# Patient Record
Sex: Female | Born: 1940 | Race: White | Hispanic: No | Smoking: Former smoker
Health system: Southern US, Community
[De-identification: ages and names within clinical notes are randomized; demographics above are authoritative.]

## PROBLEM LIST (undated history)

## (undated) DIAGNOSIS — I5020 Unspecified systolic (congestive) heart failure: Secondary | ICD-10-CM

## (undated) DIAGNOSIS — N83209 Unspecified ovarian cyst, unspecified side: Secondary | ICD-10-CM

## (undated) DIAGNOSIS — N816 Rectocele: Secondary | ICD-10-CM

## (undated) DIAGNOSIS — Q268 Other congenital malformations of great veins: Secondary | ICD-10-CM

## (undated) DIAGNOSIS — Z7901 Long term (current) use of anticoagulants: Secondary | ICD-10-CM

## (undated) DIAGNOSIS — Z8719 Personal history of other diseases of the digestive system: Secondary | ICD-10-CM

## (undated) DIAGNOSIS — I4892 Unspecified atrial flutter: Secondary | ICD-10-CM

## (undated) DIAGNOSIS — T50905A Adverse effect of unspecified drugs, medicaments and biological substances, initial encounter: Secondary | ICD-10-CM

## (undated) DIAGNOSIS — K219 Gastro-esophageal reflux disease without esophagitis: Secondary | ICD-10-CM

## (undated) DIAGNOSIS — Z5181 Encounter for therapeutic drug level monitoring: Secondary | ICD-10-CM

## (undated) DIAGNOSIS — G629 Polyneuropathy, unspecified: Secondary | ICD-10-CM

## (undated) DIAGNOSIS — F419 Anxiety disorder, unspecified: Secondary | ICD-10-CM

## (undated) DIAGNOSIS — I484 Atypical atrial flutter: Secondary | ICD-10-CM

## (undated) DIAGNOSIS — I4719 Other supraventricular tachycardia: Secondary | ICD-10-CM

## (undated) DIAGNOSIS — N35919 Unspecified urethral stricture, male, unspecified site: Secondary | ICD-10-CM

## (undated) DIAGNOSIS — I4891 Unspecified atrial fibrillation: Secondary | ICD-10-CM

## (undated) DIAGNOSIS — I10 Essential (primary) hypertension: Secondary | ICD-10-CM

## (undated) DIAGNOSIS — Z973 Presence of spectacles and contact lenses: Secondary | ICD-10-CM

## (undated) DIAGNOSIS — Z9229 Personal history of other drug therapy: Secondary | ICD-10-CM

## (undated) DIAGNOSIS — IMO0002 Reserved for concepts with insufficient information to code with codable children: Secondary | ICD-10-CM

## (undated) DIAGNOSIS — L309 Dermatitis, unspecified: Secondary | ICD-10-CM

## (undated) DIAGNOSIS — E039 Hypothyroidism, unspecified: Secondary | ICD-10-CM

## (undated) DIAGNOSIS — E785 Hyperlipidemia, unspecified: Secondary | ICD-10-CM

## (undated) DIAGNOSIS — I471 Supraventricular tachycardia: Secondary | ICD-10-CM

## (undated) DIAGNOSIS — I480 Paroxysmal atrial fibrillation: Secondary | ICD-10-CM

## (undated) DIAGNOSIS — Z79899 Other long term (current) drug therapy: Secondary | ICD-10-CM

## (undated) DIAGNOSIS — R200 Anesthesia of skin: Secondary | ICD-10-CM

## (undated) DIAGNOSIS — E871 Hypo-osmolality and hyponatremia: Secondary | ICD-10-CM

## (undated) DIAGNOSIS — I509 Heart failure, unspecified: Secondary | ICD-10-CM

## (undated) DIAGNOSIS — I440 Atrioventricular block, first degree: Secondary | ICD-10-CM

## (undated) DIAGNOSIS — Z9889 Other specified postprocedural states: Secondary | ICD-10-CM

## (undated) DIAGNOSIS — Z8679 Personal history of other diseases of the circulatory system: Secondary | ICD-10-CM

## (undated) DIAGNOSIS — K439 Ventral hernia without obstruction or gangrene: Secondary | ICD-10-CM

## (undated) HISTORY — PX: CARDIAC CATHETERIZATION: SHX172

## (undated) HISTORY — DX: Paroxysmal atrial fibrillation: I48.0

## (undated) HISTORY — DX: Polyneuropathy, unspecified: G62.9

## (undated) HISTORY — DX: Unspecified atrial fibrillation: I48.91

## (undated) HISTORY — DX: Long term (current) use of anticoagulants: Z79.01

## (undated) HISTORY — DX: Ventral hernia without obstruction or gangrene: K43.9

## (undated) HISTORY — PX: TUBAL LIGATION: SHX77

## (undated) HISTORY — DX: Atypical atrial flutter: I48.4

## (undated) HISTORY — DX: Anxiety disorder, unspecified: F41.9

## (undated) HISTORY — DX: Encounter for therapeutic drug level monitoring: Z51.81

## (undated) HISTORY — DX: Gastro-esophageal reflux disease without esophagitis: K21.9

## (undated) HISTORY — DX: Dermatitis, unspecified: L30.9

## (undated) HISTORY — PX: CARDIAC ELECTROPHYSIOLOGY STUDY AND ABLATION: SHX1294

## (undated) HISTORY — DX: Anesthesia of skin: R20.0

## (undated) HISTORY — DX: Rectocele: N81.6

## (undated) HISTORY — DX: Personal history of other diseases of the digestive system: Z87.19

## (undated) HISTORY — DX: Personal history of other drug therapy: Z92.29

## (undated) HISTORY — PX: CATARACT EXTRACTION: SUR2

## (undated) HISTORY — DX: Adverse effect of unspecified drugs, medicaments and biological substances, initial encounter: T50.905A

## (undated) HISTORY — PX: CARDIOVERSION: SHX1299

## (undated) HISTORY — DX: Unspecified atrial flutter: I48.92

## (undated) HISTORY — DX: Other long term (current) drug therapy: Z79.899

## (undated) HISTORY — DX: Hypo-osmolality and hyponatremia: E87.1

## (undated) HISTORY — DX: Unspecified ovarian cyst, unspecified side: N83.209

## (undated) HISTORY — DX: Hypothyroidism, unspecified: E03.9

## (undated) HISTORY — DX: Heart failure, unspecified: I50.9

## (undated) HISTORY — PX: ABDOMINAL HERNIA REPAIR: SHX539

---

## 1976-08-10 HISTORY — PX: TUBAL LIGATION: SHX77

## 1984-08-10 HISTORY — PX: ABDOMINAL HYSTERECTOMY: SHX81

## 1985-06-10 HISTORY — PX: ABDOMINAL HYSTERECTOMY: SHX81

## 2005-07-15 ENCOUNTER — Inpatient Hospital Stay (HOSPITAL_COMMUNITY): Admission: AD | Admit: 2005-07-15 | Discharge: 2005-07-17 | Payer: Self-pay | Admitting: Cardiology

## 2005-07-15 ENCOUNTER — Ambulatory Visit: Payer: Self-pay | Admitting: Cardiology

## 2009-04-08 HISTORY — PX: CARDIOVASCULAR STRESS TEST: SHX262

## 2009-05-20 HISTORY — PX: TRANSESOPHAGEAL ECHOCARDIOGRAM: SHX273

## 2010-09-08 ENCOUNTER — Ambulatory Visit
Admission: RE | Admit: 2010-09-08 | Discharge: 2010-09-08 | Payer: Self-pay | Source: Home / Self Care | Attending: Urology | Admitting: Urology

## 2010-09-08 HISTORY — PX: OTHER SURGICAL HISTORY: SHX169

## 2010-09-08 LAB — POCT I-STAT, CHEM 8
BUN: 11 mg/dL (ref 6–23)
Calcium, Ion: 1.22 mmol/L (ref 1.12–1.32)
Chloride: 105 mEq/L (ref 96–112)
Creatinine, Ser: 1 mg/dL (ref 0.4–1.2)
Glucose, Bld: 111 mg/dL — ABNORMAL HIGH (ref 70–99)
HCT: 43 % (ref 36.0–46.0)
Hemoglobin: 14.6 g/dL (ref 12.0–15.0)
Potassium: 4 mEq/L (ref 3.5–5.1)
Sodium: 139 mEq/L (ref 135–145)
TCO2: 24 mmol/L (ref 0–100)

## 2010-09-08 LAB — PROTIME-INR
INR: 1.03 (ref 0.00–1.49)
Prothrombin Time: 13.7 seconds (ref 11.6–15.2)

## 2010-09-08 NOTE — Op Note (Signed)
NAME:  [name], [name]               ACCOUNT NO.:  [id number]  MEDICAL RECORD NO.:  [id number]          PATIENT TYPE:  AMB  LOCATION:  N[facility]C                         FACILITY:  [facility]  PHYSICIAN:  Jarrick Fjeld C. Vernie Ammons, M.D.  DATE OF BIRTH:  [birth year]  DATE OF PROCEDURE: DATE OF DISCHARGE:                              OPERATIVE REPORT   PREOPERATIVE DIAGNOSES: 1. Cystocele. 2. Stress urinary continence.  POSTOPERATIVE DIAGNOSES: 1. Cystocele. 2. Stress urinary continence.  PROCEDURES: 1. Anterior repair. 2. Transobturator sling.  SURGEON:  Allyn Bartelson C. Vernie Ammons, M.D.  ANESTHESIA:  General.  SPECIMENS:  None.  DRAINS:  None.  PACKING:  Iodoform with bacitracin ointment.  BLOOD LOSS:  Minimal.  COMPLICATIONS:  None.  INDICATIONS:  The patient is a 70 year old female with a significant cystocele.  She has tried a pessary but failed that.  She had no stress incontinence with the cystocele present but when it was reduced this resulted in unmasking of stress urinary incontinence.  We therefore have discussed repair of her cystocele which she would like to proceed with and simultaneous sling placement.  The risks, complications, alternatives and limitations have been discussed and she understands and has elected to proceed.  DESCRIPTION OF OPERATION:  After informed consent, the patient was brought to the major OR, placed on table, administered general anesthesia, and then moved to the dorsal lithotomy position.  The vagina and perineal region was sterilely prepped and draped and official time- out was then performed.  Initially, I placed a 16-French Foley catheter in the bladder and drained the bladder.  I then placed a weighted speculum in the vagina. Marcaine with epinephrine was used to infiltrate the subvaginal mucosa in the midline.  After allowing adequate time for epinephrine effect, a midline incision was then made from the introitus back to the vaginal cuff.  Allis  clamps were placed on the vaginal mucosal edges and the cystocele were then both sharply and bluntly dissected from the vaginal mucosa out laterally until the pubocervical fascia was identified.  The cystocele was reduced by using 2-0 Ethibond suture in a figure-of- eight fashion, reapproximating the pubocervical fascia in the midline. I did this an interrupted fashion which reduced her cystocele completely.  I then excised the redundant vaginal mucosa and began to close the vaginal incision by reapproximating the mucosal edges with running 2-0 Vicryl suture.  I then irrigated the wound with antibiotic solution and drained the bladder once again.  The obturator fossa was then palpated and at a location even with the clitoris 5 cm lateral to the midline on each side, a Debbera Wolken was placed and this was confirmed to be in the area of the obturator fossa.  The skin and subcutaneous tissue were infiltrated with Marcaine and epinephrine and then a stab incision was made in these locations.  With the bladder completely drained, I passed the sling trocar through the stab incision, through the obturator fascia and back behind the symphysis pubis and then directed this out at the mid urethral level first on left and then right sides.  The sling material was then affixed to the trocar and drawn back  through the skin incisions.  The catheter was removed and cystoscopy was performed using a 22-French cystoscope and 70-degree lens.  The bladder was entered and fully inspected in a systematic fashion.  Ureteral orifices were of normal configuration and position.  There were no tumor, stones or inflammatory lesions seen within the bladder.  There no perforation or foreign body identified.  The bladder was drained, the cystoscope removed and the Foley catheter reinserted.  I then adjusted the sling under the mid urethral region and then removed the plastic sheathing on both right and left sides  with forceps placed beneath the sling in order to prevent tension.  The sling laid in good position at the mid urethral level with no tension.  It was therefore irrigated once again and the excess sling material was excised at the skin level.  The skin incisions were closed with Dermabond.  I irrigated the vaginal incision again with antibiotic solution and then proceeded to close the vaginal incision with the initially started 2-0 Vicryl suture.  I then placed 1-inch iodoform gauze with bacitracin as a vaginal packing, removed the Foley catheter and the patient was awakened and taken to recovery room in stable and satisfactory condition.  She tolerated the procedure well with no intraoperative complications.  She will be given a prescription for Tylox #30 as well as Cipro 500 mg #6 with followup in my office in 1 week.  Written instructions were given and she will remove her vaginal packing in the morning.     Kinza Gouveia C. Vernie Ammons, M.D.     MCO/MEDQ  D:  09/08/2010  T:  09/08/2010  Job:  [id number]  Electronically Signed by Ihor Gully M.D. on 09/08/2010 06:15:53 PM

## 2010-12-26 NOTE — Consult Note (Signed)
[name], [name]               ACCOUNT NO.:  [id number]   MEDICAL RECORD NO.:  [id number]          PATIENT TYPE:  INP   LOCATION:  2910                         FACILITY:  [facility]   PHYSICIAN:  [place] Bing, M.D. [facility] OF BIRTH:  1941/04/03   DATE OF CONSULTATION:  07/15/2005  DATE OF DISCHARGE:                                   CONSULTATION   PRIMARY CARE PHYSICIAN:  Dr. Tomasa Blase.   HISTORY OF PRESENT ILLNESS:  A 70 year old woman without known coronary  disease, was transferred from [facility] due to marked EKG  abnormalities associated with chest burning, hypotension and bradycardia.  [name] first came to cardiology attention in 1991 when she presented to  [facility] with atypical chest discomfort. She underwent coronary angiography with  apparently normal results. She has had an echocardiogram in the past and  been told of mitral valve prolapse. She dates her current problems to the  last month or two when she has noted intermittent episodes of weakness.  These are unrelated to exertion. There was no chest discomfort nor dyspnea.  She developed more continuous malaise, prompting a visit to her primary care  physician yesterday morning. Her EKG was abnormal, prompting a decision to  perform outpatient stress testing -- which was scheduled. That evening, she  developed chest burning and came to the emergency department a [facility], where her symptoms worsened. An EKG was repeated and was in fact  markedly abnormal. We do not have the initial tracing for comparison.   She had accompanying nausea, but no dyspnea nor diaphoresis. She developed  bradycardia and hypotension, with a systolic blood pressure approximately  70.  Her treatment was initiated with intravenous heparin, Integrilin and an  aspirin. Her symptoms subsequently resolved, as did hypotension and  bradycardia; however, EKG abnormalities persisted. She was transferred to  [facility] where she  is now asymptomatic.   There is no history of hypertension. The patient was a cigarette smoker,  with a 40 pack-year consumption until approximately 2 years ago. She has not  had hypertension nor diabetes. She has been told of hyperlipidemia in the  past, and treated this with red yeast rice, rather than the recommended  statin.   PAST MEDICAL HISTORY:  Is otherwise notable for hypothyroidism, episodic  depression and GERD. Her only surgery has been a transabdominal  hysterectomy.   RECENT MEDICATIONS:  Have included:  1.  Levothyroxine 0.05 mg q.d.  2.  Prevacid p.r.n.  3.  Lexapro 10 mg q.d.   ALLERGIES:  She reports allergies to AMOXICILLIN and CELEBREX.   SOCIAL HISTORY:  Widowed for the past 8 years and lives alone; retired from  [employer] work; no excessive alcohol use.   FAMILY HISTORY:  Father had multiple myocardial infarctions and eventually  died of coronary disease. Mother died in her 34s with a history of CVA and  CHF.   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 All other systems were  reviewed and are negative.   PHYSICAL EXAMINATION:  GENERAL:  On exam, pleasant, well-appearing woman in  no acute distress.  VITAL SIGNS:  The heart rate is 80 and regular, blood pressure 105/60,  respirations 15, afebrile.  HEENT:  Anicteric sclerae; pupils equal, round, react to light; EOMs full.  NECK: No jugular venous distension; normal carotid upstrokes without bruits.  ENDOCRINE:  No thyromegaly.  HEMATOPOIETIC:  No adenopathy.  SKIN:  No significant lesions.  PSYCHIATRIC: Alert and oriented; normal affect.  CHEST: Resonant to percussion; clear to auscultation.  CARDIAC: Normal first and second heart sounds; fourth heart sound present;  normal PMI.  ABDOMEN: Soft and nontender; Pfannenstiel skin incision; no bruits; normal  bowel sounds; no  masses; no organomegaly.  EXTREMITIES:  Distal pulses intact; no edema.  NEUROMUSCULAR:  Symmetric strength and tone; normal cranial nerves.  MUSCULOSKELETAL:  No joint deformities.   EKG:  Normal sinus rhythm; prominent inferior and anterolateral ST-segment  depression with T-wave inversion. When compared to a tracing obtained 2  hours earlier, the heart rate has increased; inferior ST-segment changes are  more impressive; anterior lateral changes are similar.   OTHER LABORATORY:  (Obtained in [facility]) includes:  an essentially normal  chemistry profile, with slightly decreased sodium, slightly decreased  bicarbonate, slightly increased glucose, slightly increased protein and  slightly increased SGOT. CPK was elevated to 295, with MB of 10.2; but  troponin was negative. The PT and PTT were normal. CBC was normal, except  for white count of 13,700. T4 and T3 were normal, with slightly elevated TSH  of 5.4.   IMPRESSION:  A 70 year old woman with modest cardiovascular risk factors,  presenting with impressive EKG changes and significant symptoms consistent  with unstable angina. Her symptoms have resolved, but EKG changes persist --  perhaps in an apical distribution.   PLAN:  Clopidogrel will be added to her medical regime. We will try to  increase IV fluids and wean her from dopamine. If this is successful,  intravenous nitroglycerin will be given. Cardiac catheterization is planned.  Lipids will be assessed.      [place] Bing, M.D. [facility]  Electronically Signed     RR/MEDQ  D:  07/15/2005  T:  07/15/2005  Job:  [id number]

## 2010-12-26 NOTE — Cardiovascular Report (Signed)
[name], [name]               ACCOUNT NO.:  [id number]   MEDICAL RECORD NO.:  [id number]          PATIENT TYPE:  INP   LOCATION:  2910                         FACILITY:  [facility]   PHYSICIAN:  Arturo Morton. Riley Kill, M.D. [facility] OF BIRTH:  07/09/1941   DATE OF PROCEDURE:  07/15/2005  DATE OF DISCHARGE:                              CARDIAC CATHETERIZATION   INDICATIONS:  Ms. Rinn is a 70 year old who previously underwent cardiac  catheterization at [facility] in [facility]. She has presented with a one-month history  of some weakness. She had some chest pressure. She had an abnormal EKG and  borderline CK-MBs. Troponins were negative. She had some hypotension. She  was subsequently referred for cardiac catheterization.   PROCEDURES:  1.  Left heart catheterization.  2.  Selective coronary arteriography.  3.  Selective left ventriculography.   DESCRIPTION OF PROCEDURE:  The patient was brought to the catheterization  laboratory and prepped and draped in the usual fashion.  Through an anterior  puncture, the right femoral artery was easily entered, and a 6-French sheath  was placed.  Views of the left and right coronary arteries were obtained in  multiple angiographic projections. Central aortic and left ventricular  pressures were measured with the pigtail. Ventriculography was performed in  the RAO projection. Overall, systolic function was preserved. I had Dr.  Diona Browner come down and review the films with me. After a careful discussion,  we elected not to proceed with any type of percutaneous intervention. The  patient was taken to the holding area in satisfactory clinical condition.  Residual CPK, MBs, and troponins as well as a D-dimer will be obtained.  Further evaluation will be based on the patient findings. We will attempt to  exclude pulmonary embolus as a cause of her presentation as well as watch  for evidence of acute coronary syndrome. I also spoke with Dr. Sylvie Farrier was in  the process  of looking for old EKGs so that we can compare the current study  to that.   HEMODYNAMIC DATA:  1.  Central aortic pressure 117/64, mean 87.  2.  Left ventricular pressure 108/8.  3.  No gradient on pullback across the aortic valve.   ANGIOGRAPHIC DATA:  1.  Left main was free of critical disease.  2.  The LAD coursed to the apex. The LAD divided into a large diagonal      branch and an LAD in the mid vessel.  Just after the takeoff of the      diagonal branch, there was about an eccentric area of 40% narrowing. All      of [place] looked at this in careful detail.  It did not clearly look like a      ruptured plaque. It also did not appear to be critical.  3.  There is a circumflex intermediate that is small and without critical      narrowing with other than minor luminal irregularity in its mid-portion.  4.  The circumflex consists of two modest size marginal branches which are      free of critical disease.  5.  The right coronary is a fairly large-caliber vessel providing a      posterior descending and posterolateral branch. There is a fairly short      discrete area of 40-50% narrowing in the mid-vessel followed by a      somewhat more segmental area of mild luminal plaquing. However, none of      this appears to be critical.   Ventriculography in the RAO projection was associated with some ventricular  ectopy. However, post PVC beats suggested fairly vigorous overall left  ventricular function without a definite wall motion abnormality.   CONCLUSION:  1.  Preserved left ventricular function.  2.  A 40% lesion of the left anterior descending artery after the takeoff of      the diagonal branch.  3.  40-50% mid-RCA stenosis.   DISPOSITION:  I reviewed the films with Dr. Diona Browner. Neither of [place] feel  that percutaneous intervention is warranted at the present time. It is not  clear to me whether the patient actually had a non-ST-elevation MI or not.  We will get serial enzymes.  We will also check a D-dimer, and I will have a  low threshold to do a CT angiogram to exclude pulmonary embolus as another  potential alternative diagnosis.  Moreover, I have spoken with Dr. Sylvie Farrier,  and he is checking the old records to see if her abnormalities from an  electrocardiographic standpoint of chronic. She does carry a diagnosis of  mitral valve prolapse, and reportedly had an abnormal stress test in the  past leading to cardiac catheterization in 1991 at [facility]. They told her they was surprised that her angiogram was normal at  that time, according to the patient. Based on this, we will likely recommend  a continued medical program. I will add Plavix to her regimen, and we will  decide on further diagnostic testing.      Arturo Morton. Riley Kill, M.D. [facility]  Electronically Signed     TDS/MEDQ  D:  07/15/2005  T:  07/15/2005  Job:  [id number]   cc:   [place] Bing, M.D. [facility]  [address]  [address]  [place]  [place] [address]   Heide Guile, MD  Fax: [phone]   Barney Drain, M.D.   [facility]

## 2010-12-26 NOTE — Discharge Summary (Signed)
NAMELIYANA, SUNIGA               ACCOUNT NO.:  [id number]   MEDICAL RECORD NO.:  [id number]          PATIENT TYPE:  INP   LOCATION:  2017                         FACILITY:  [facility]   PHYSICIAN:  Jonelle Sidle, M.D. LHCDATE OF BIRTH:  November 27, 1940   DATE OF ADMISSION:  07/15/2005  DATE OF DISCHARGE:  07/17/2005                           DISCHARGE SUMMARY - REFERRING   SUMMARY OF HISTORY:  Ms. Dieu is a 70 year old white female who was  transferred from [facility] secondary to EKG abnormalities associated  with chest burning, hypotension, and bradycardia.   Her medical history is notable for cardiac catheterization at [facility] in 1991  with reported normal results. Echocardiogram in the past, according to the  patient, has shown mitral valve prolapse. She has a history of remote  tobacco abuse, hyperlipidemia, hypothyroidism, episodic depression, and  GERD.   LABORATORY DATA:  Labs at [facility] show a sodium of 136, potassium  4.4, BUN 15, creatinine 1.0, glucose 71. H&H 14.1 and 41.6, normal indices,  platelets 275,000, WBC 8.1. TSH was slightly elevated at 5.40, however T4  was 8.4 and T3 uptake was 1.02. PTT 31.7, PT 10.2. Initial CK-MB at [facility] was elevated at 294 and 10.2. Troponin was 0.00. EKGs at [facility] showed normal sinus rhythm, inferolateral ST segment abnormalities.  A subsequent EKG from [facility], dated on the 6th, shows sinus  bradycardia of 49, continued ST-segment abnormalities. Chest x-ray from  [place] did not show any abnormality, chronic lung disease. On transfer,  H&H was 12.8 and 36.2, normal indices, platelet count 249,000. WBC 9.2.  Subsequent hematologies were all unremarkable. D-dimer was less than 0.22.  On transfer sodium was 133, potassium 23.6, BUN 7, creatinine 0.8, normal  LFTs, glucose 141. Subsequent chemistries were unremarkable. On transfer  total CK was elevated 213 with an MB of 6.6 and relative  index of 3.1.  Troponin was 0.01.  Subsequent CK, total MB, and relative index were  negative for myocardial infarction. Subsequent troponins were 0.23 and 0.03.  Fasting lipid showed a total cholesterol of 229, triglycerides 58, HDL 54,  LDL 163. EKGs at [facility] showed normal sinus rhythm, persistent ST-T  wave inversion inferolaterally.   HOSPITAL COURSE:  Ms. Ashlock was admitted to 2910, continued on her home  medications, and placed on IV heparin. On the afternoon of the 6th Dr.  Riley Kill performed cardiac catheterization which showed normal left  ventricular function, 40% LAD, and 40% to 50% mid RCA. A D-dimer was checked  which was also negative. Dr. Riley Kill, after review, did not feel her cardiac  catheterization showed significant coronary artery disease, however he  recommended continued Plavix and aggressive risk factor modification. On  December 7th she was assessed by Dr. Tenny Craw and the patient stated that she  felt fuzzy in the head and not just quite right.  Dr. Tenny Craw felt that she  should be continued to be treated with aspirin and Plavix at this time, but  the Plavix could possibly be discontinued in the future. She again  recommended Statin for her hyperlipidemia to get the  LDL approximately 80  and recommended Vytorin.  The patient was transferred to the floor with  increased activity. Dr. Tenny Craw also noted prior to admission she was taking  800 of ibuprofen every day for shoulder discomfort. Case management assisted  for discharge needs. By July 17, 2005, despite being D-dimer being  negative, it was felt that she should undergo a chest CT to exclude aortic  dissection and pulmonary embolism. This was performed and did not show any  acute abnormalities. Findings were discussed with the patient and she stated  that she wished to be discharged. Dr. Diona Browner was in agreement with this  and referred any further workup to Dr. Tomasa Blase and Dr. Phoebe Perch.   PROCEDURE PERFORMED:   Cardiac catheterization on July 15, 2005.   DISCHARGE DIAGNOSES:  1.  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  2.  Hyperlipidemia.  3.  Abnormal electrocardiogram.  4.  Hypotension.  5.  Bradycardia of uncertain etiology.   DISPOSITION:  She was asked to maintain a low-fat, low-salt, low-cholesterol  diet. Activity is per the supplemental discharge sheet. She is asked to  bring all medications to all follow-up appointments. Her new medications  include Vytorin 10/20 mg at bedtime, aspirin 325 mg daily, Plavix 75 mg  daily per Dr. Phoebe Perch to determine the duration. She was asked to continue  her levothyroxine 0.5 mg daily, Prevacid 20 mg daily rather than as needed,  and Lexapro 10 mg daily. She was asked to call Dr. Tomasa Blase and Dr. Phoebe Perch  for follow-up appointments. Given Vytorin was started for her hyperlipidemia  she will need bloodwork in approximately six to eight weeks to re-assess her  lipids and liver since Vytorin was initiated.      Joellyn Rued, P.A. [facility]    ______________________________  Jonelle Sidle, M.D. [facility]    EW/MEDQ  D:  07/17/2005  T:  07/17/2005  Job:  [id number]   cc:   Dr. Wannetta Sender, [place]   Dr. Fransico Him, [place]

## 2011-08-24 DIAGNOSIS — I4891 Unspecified atrial fibrillation: Secondary | ICD-10-CM | POA: Diagnosis not present

## 2011-08-24 DIAGNOSIS — Z7901 Long term (current) use of anticoagulants: Secondary | ICD-10-CM | POA: Diagnosis not present

## 2011-08-27 DIAGNOSIS — Z1231 Encounter for screening mammogram for malignant neoplasm of breast: Secondary | ICD-10-CM | POA: Diagnosis not present

## 2011-09-21 DIAGNOSIS — Z7901 Long term (current) use of anticoagulants: Secondary | ICD-10-CM | POA: Diagnosis not present

## 2011-09-21 DIAGNOSIS — I4891 Unspecified atrial fibrillation: Secondary | ICD-10-CM | POA: Diagnosis not present

## 2011-10-01 DIAGNOSIS — E785 Hyperlipidemia, unspecified: Secondary | ICD-10-CM | POA: Diagnosis not present

## 2011-10-01 DIAGNOSIS — I4891 Unspecified atrial fibrillation: Secondary | ICD-10-CM | POA: Diagnosis not present

## 2011-10-01 DIAGNOSIS — I10 Essential (primary) hypertension: Secondary | ICD-10-CM | POA: Diagnosis not present

## 2011-10-09 DIAGNOSIS — I4891 Unspecified atrial fibrillation: Secondary | ICD-10-CM | POA: Diagnosis not present

## 2011-10-09 DIAGNOSIS — K219 Gastro-esophageal reflux disease without esophagitis: Secondary | ICD-10-CM | POA: Diagnosis not present

## 2011-10-09 DIAGNOSIS — I10 Essential (primary) hypertension: Secondary | ICD-10-CM | POA: Diagnosis not present

## 2011-10-09 DIAGNOSIS — E785 Hyperlipidemia, unspecified: Secondary | ICD-10-CM | POA: Diagnosis not present

## 2011-10-19 DIAGNOSIS — I10 Essential (primary) hypertension: Secondary | ICD-10-CM | POA: Diagnosis not present

## 2011-10-19 DIAGNOSIS — Z7901 Long term (current) use of anticoagulants: Secondary | ICD-10-CM | POA: Diagnosis not present

## 2011-10-19 DIAGNOSIS — Z79899 Other long term (current) drug therapy: Secondary | ICD-10-CM | POA: Diagnosis not present

## 2011-10-19 DIAGNOSIS — E039 Hypothyroidism, unspecified: Secondary | ICD-10-CM | POA: Diagnosis not present

## 2011-10-19 DIAGNOSIS — I4891 Unspecified atrial fibrillation: Secondary | ICD-10-CM | POA: Diagnosis not present

## 2011-10-19 DIAGNOSIS — Z6826 Body mass index (BMI) 26.0-26.9, adult: Secondary | ICD-10-CM | POA: Diagnosis not present

## 2011-10-19 DIAGNOSIS — E785 Hyperlipidemia, unspecified: Secondary | ICD-10-CM | POA: Diagnosis not present

## 2011-10-20 DIAGNOSIS — N760 Acute vaginitis: Secondary | ICD-10-CM | POA: Diagnosis not present

## 2011-10-20 DIAGNOSIS — N816 Rectocele: Secondary | ICD-10-CM | POA: Diagnosis not present

## 2011-11-03 DIAGNOSIS — L608 Other nail disorders: Secondary | ICD-10-CM | POA: Diagnosis not present

## 2011-11-11 DIAGNOSIS — I4891 Unspecified atrial fibrillation: Secondary | ICD-10-CM | POA: Diagnosis not present

## 2011-11-11 DIAGNOSIS — K219 Gastro-esophageal reflux disease without esophagitis: Secondary | ICD-10-CM | POA: Diagnosis not present

## 2011-11-11 DIAGNOSIS — I10 Essential (primary) hypertension: Secondary | ICD-10-CM | POA: Diagnosis not present

## 2011-11-11 DIAGNOSIS — E785 Hyperlipidemia, unspecified: Secondary | ICD-10-CM | POA: Diagnosis not present

## 2011-11-16 DIAGNOSIS — I4891 Unspecified atrial fibrillation: Secondary | ICD-10-CM | POA: Diagnosis not present

## 2011-11-16 DIAGNOSIS — Z7901 Long term (current) use of anticoagulants: Secondary | ICD-10-CM | POA: Diagnosis not present

## 2011-12-14 DIAGNOSIS — Z7901 Long term (current) use of anticoagulants: Secondary | ICD-10-CM | POA: Diagnosis not present

## 2011-12-14 DIAGNOSIS — I4891 Unspecified atrial fibrillation: Secondary | ICD-10-CM | POA: Diagnosis not present

## 2011-12-21 DIAGNOSIS — I4891 Unspecified atrial fibrillation: Secondary | ICD-10-CM | POA: Diagnosis not present

## 2011-12-21 DIAGNOSIS — I10 Essential (primary) hypertension: Secondary | ICD-10-CM | POA: Diagnosis not present

## 2011-12-21 DIAGNOSIS — E785 Hyperlipidemia, unspecified: Secondary | ICD-10-CM | POA: Diagnosis not present

## 2011-12-30 DIAGNOSIS — I4891 Unspecified atrial fibrillation: Secondary | ICD-10-CM | POA: Diagnosis not present

## 2011-12-30 DIAGNOSIS — Z7901 Long term (current) use of anticoagulants: Secondary | ICD-10-CM | POA: Diagnosis not present

## 2012-01-07 DIAGNOSIS — R233 Spontaneous ecchymoses: Secondary | ICD-10-CM | POA: Diagnosis not present

## 2012-01-07 DIAGNOSIS — L570 Actinic keratosis: Secondary | ICD-10-CM | POA: Diagnosis not present

## 2012-01-07 DIAGNOSIS — L821 Other seborrheic keratosis: Secondary | ICD-10-CM | POA: Diagnosis not present

## 2012-01-13 DIAGNOSIS — Z7901 Long term (current) use of anticoagulants: Secondary | ICD-10-CM | POA: Diagnosis not present

## 2012-01-13 DIAGNOSIS — I4891 Unspecified atrial fibrillation: Secondary | ICD-10-CM | POA: Diagnosis not present

## 2012-01-27 DIAGNOSIS — Z7901 Long term (current) use of anticoagulants: Secondary | ICD-10-CM | POA: Diagnosis not present

## 2012-01-27 DIAGNOSIS — I4891 Unspecified atrial fibrillation: Secondary | ICD-10-CM | POA: Diagnosis not present

## 2012-02-15 DIAGNOSIS — Z6827 Body mass index (BMI) 27.0-27.9, adult: Secondary | ICD-10-CM | POA: Diagnosis not present

## 2012-02-15 DIAGNOSIS — I10 Essential (primary) hypertension: Secondary | ICD-10-CM | POA: Diagnosis not present

## 2012-02-15 DIAGNOSIS — E785 Hyperlipidemia, unspecified: Secondary | ICD-10-CM | POA: Diagnosis not present

## 2012-02-15 DIAGNOSIS — E039 Hypothyroidism, unspecified: Secondary | ICD-10-CM | POA: Diagnosis not present

## 2012-02-15 DIAGNOSIS — Z7901 Long term (current) use of anticoagulants: Secondary | ICD-10-CM | POA: Diagnosis not present

## 2012-02-15 DIAGNOSIS — I4891 Unspecified atrial fibrillation: Secondary | ICD-10-CM | POA: Diagnosis not present

## 2012-02-22 DIAGNOSIS — I4891 Unspecified atrial fibrillation: Secondary | ICD-10-CM | POA: Diagnosis not present

## 2012-02-22 DIAGNOSIS — Z7901 Long term (current) use of anticoagulants: Secondary | ICD-10-CM | POA: Diagnosis not present

## 2012-03-07 DIAGNOSIS — I4891 Unspecified atrial fibrillation: Secondary | ICD-10-CM | POA: Diagnosis not present

## 2012-03-07 DIAGNOSIS — Z7901 Long term (current) use of anticoagulants: Secondary | ICD-10-CM | POA: Diagnosis not present

## 2012-03-21 DIAGNOSIS — Z7901 Long term (current) use of anticoagulants: Secondary | ICD-10-CM | POA: Diagnosis not present

## 2012-03-21 DIAGNOSIS — I4891 Unspecified atrial fibrillation: Secondary | ICD-10-CM | POA: Diagnosis not present

## 2012-03-28 DIAGNOSIS — J019 Acute sinusitis, unspecified: Secondary | ICD-10-CM | POA: Diagnosis not present

## 2012-03-28 DIAGNOSIS — E049 Nontoxic goiter, unspecified: Secondary | ICD-10-CM | POA: Diagnosis not present

## 2012-03-28 DIAGNOSIS — I10 Essential (primary) hypertension: Secondary | ICD-10-CM | POA: Diagnosis not present

## 2012-03-28 DIAGNOSIS — N390 Urinary tract infection, site not specified: Secondary | ICD-10-CM | POA: Diagnosis not present

## 2012-04-04 DIAGNOSIS — Z7901 Long term (current) use of anticoagulants: Secondary | ICD-10-CM | POA: Diagnosis not present

## 2012-04-04 DIAGNOSIS — I4891 Unspecified atrial fibrillation: Secondary | ICD-10-CM | POA: Diagnosis not present

## 2012-04-06 DIAGNOSIS — Z7901 Long term (current) use of anticoagulants: Secondary | ICD-10-CM | POA: Diagnosis not present

## 2012-04-06 DIAGNOSIS — I4891 Unspecified atrial fibrillation: Secondary | ICD-10-CM | POA: Diagnosis not present

## 2012-04-13 DIAGNOSIS — I4891 Unspecified atrial fibrillation: Secondary | ICD-10-CM | POA: Diagnosis not present

## 2012-04-13 DIAGNOSIS — Z7901 Long term (current) use of anticoagulants: Secondary | ICD-10-CM | POA: Diagnosis not present

## 2012-04-18 DIAGNOSIS — H9209 Otalgia, unspecified ear: Secondary | ICD-10-CM | POA: Diagnosis not present

## 2012-04-19 DIAGNOSIS — H9209 Otalgia, unspecified ear: Secondary | ICD-10-CM | POA: Diagnosis not present

## 2012-04-19 DIAGNOSIS — M5382 Other specified dorsopathies, cervical region: Secondary | ICD-10-CM | POA: Diagnosis not present

## 2012-04-19 DIAGNOSIS — S139XXA Sprain of joints and ligaments of unspecified parts of neck, initial encounter: Secondary | ICD-10-CM | POA: Diagnosis not present

## 2012-04-19 DIAGNOSIS — M62838 Other muscle spasm: Secondary | ICD-10-CM | POA: Diagnosis not present

## 2012-04-20 DIAGNOSIS — Z7901 Long term (current) use of anticoagulants: Secondary | ICD-10-CM | POA: Diagnosis not present

## 2012-04-20 DIAGNOSIS — I4891 Unspecified atrial fibrillation: Secondary | ICD-10-CM | POA: Diagnosis not present

## 2012-04-22 DIAGNOSIS — M2560 Stiffness of unspecified joint, not elsewhere classified: Secondary | ICD-10-CM | POA: Diagnosis not present

## 2012-04-22 DIAGNOSIS — R293 Abnormal posture: Secondary | ICD-10-CM | POA: Diagnosis not present

## 2012-04-22 DIAGNOSIS — R6884 Jaw pain: Secondary | ICD-10-CM | POA: Diagnosis not present

## 2012-04-22 DIAGNOSIS — IMO0001 Reserved for inherently not codable concepts without codable children: Secondary | ICD-10-CM | POA: Diagnosis not present

## 2012-04-22 DIAGNOSIS — M26609 Unspecified temporomandibular joint disorder, unspecified side: Secondary | ICD-10-CM | POA: Diagnosis not present

## 2012-04-22 DIAGNOSIS — M62838 Other muscle spasm: Secondary | ICD-10-CM | POA: Diagnosis not present

## 2012-04-25 DIAGNOSIS — R6884 Jaw pain: Secondary | ICD-10-CM | POA: Diagnosis not present

## 2012-04-25 DIAGNOSIS — M26609 Unspecified temporomandibular joint disorder, unspecified side: Secondary | ICD-10-CM | POA: Diagnosis not present

## 2012-04-25 DIAGNOSIS — IMO0001 Reserved for inherently not codable concepts without codable children: Secondary | ICD-10-CM | POA: Diagnosis not present

## 2012-04-25 DIAGNOSIS — M2560 Stiffness of unspecified joint, not elsewhere classified: Secondary | ICD-10-CM | POA: Diagnosis not present

## 2012-04-25 DIAGNOSIS — R293 Abnormal posture: Secondary | ICD-10-CM | POA: Diagnosis not present

## 2012-04-25 DIAGNOSIS — M62838 Other muscle spasm: Secondary | ICD-10-CM | POA: Diagnosis not present

## 2012-04-28 DIAGNOSIS — M62838 Other muscle spasm: Secondary | ICD-10-CM | POA: Diagnosis not present

## 2012-04-28 DIAGNOSIS — M26609 Unspecified temporomandibular joint disorder, unspecified side: Secondary | ICD-10-CM | POA: Diagnosis not present

## 2012-04-28 DIAGNOSIS — R6884 Jaw pain: Secondary | ICD-10-CM | POA: Diagnosis not present

## 2012-04-28 DIAGNOSIS — R293 Abnormal posture: Secondary | ICD-10-CM | POA: Diagnosis not present

## 2012-04-28 DIAGNOSIS — IMO0001 Reserved for inherently not codable concepts without codable children: Secondary | ICD-10-CM | POA: Diagnosis not present

## 2012-04-28 DIAGNOSIS — M2560 Stiffness of unspecified joint, not elsewhere classified: Secondary | ICD-10-CM | POA: Diagnosis not present

## 2012-05-02 DIAGNOSIS — E785 Hyperlipidemia, unspecified: Secondary | ICD-10-CM | POA: Diagnosis not present

## 2012-05-02 DIAGNOSIS — I4891 Unspecified atrial fibrillation: Secondary | ICD-10-CM | POA: Diagnosis not present

## 2012-05-02 DIAGNOSIS — I498 Other specified cardiac arrhythmias: Secondary | ICD-10-CM | POA: Diagnosis not present

## 2012-05-02 DIAGNOSIS — I10 Essential (primary) hypertension: Secondary | ICD-10-CM | POA: Diagnosis not present

## 2012-05-03 DIAGNOSIS — M62838 Other muscle spasm: Secondary | ICD-10-CM | POA: Diagnosis not present

## 2012-05-03 DIAGNOSIS — M2560 Stiffness of unspecified joint, not elsewhere classified: Secondary | ICD-10-CM | POA: Diagnosis not present

## 2012-05-03 DIAGNOSIS — R6884 Jaw pain: Secondary | ICD-10-CM | POA: Diagnosis not present

## 2012-05-03 DIAGNOSIS — R293 Abnormal posture: Secondary | ICD-10-CM | POA: Diagnosis not present

## 2012-05-03 DIAGNOSIS — IMO0001 Reserved for inherently not codable concepts without codable children: Secondary | ICD-10-CM | POA: Diagnosis not present

## 2012-05-03 DIAGNOSIS — M26609 Unspecified temporomandibular joint disorder, unspecified side: Secondary | ICD-10-CM | POA: Diagnosis not present

## 2012-05-04 DIAGNOSIS — Z7901 Long term (current) use of anticoagulants: Secondary | ICD-10-CM | POA: Diagnosis not present

## 2012-05-04 DIAGNOSIS — I4891 Unspecified atrial fibrillation: Secondary | ICD-10-CM | POA: Diagnosis not present

## 2012-05-05 DIAGNOSIS — R6884 Jaw pain: Secondary | ICD-10-CM | POA: Diagnosis not present

## 2012-05-05 DIAGNOSIS — IMO0001 Reserved for inherently not codable concepts without codable children: Secondary | ICD-10-CM | POA: Diagnosis not present

## 2012-05-05 DIAGNOSIS — M26609 Unspecified temporomandibular joint disorder, unspecified side: Secondary | ICD-10-CM | POA: Diagnosis not present

## 2012-05-05 DIAGNOSIS — R293 Abnormal posture: Secondary | ICD-10-CM | POA: Diagnosis not present

## 2012-05-05 DIAGNOSIS — M62838 Other muscle spasm: Secondary | ICD-10-CM | POA: Diagnosis not present

## 2012-05-05 DIAGNOSIS — M2560 Stiffness of unspecified joint, not elsewhere classified: Secondary | ICD-10-CM | POA: Diagnosis not present

## 2012-05-10 DIAGNOSIS — R293 Abnormal posture: Secondary | ICD-10-CM | POA: Diagnosis not present

## 2012-05-10 DIAGNOSIS — M62838 Other muscle spasm: Secondary | ICD-10-CM | POA: Diagnosis not present

## 2012-05-10 DIAGNOSIS — M2560 Stiffness of unspecified joint, not elsewhere classified: Secondary | ICD-10-CM | POA: Diagnosis not present

## 2012-05-10 DIAGNOSIS — R6884 Jaw pain: Secondary | ICD-10-CM | POA: Diagnosis not present

## 2012-05-10 DIAGNOSIS — IMO0001 Reserved for inherently not codable concepts without codable children: Secondary | ICD-10-CM | POA: Diagnosis not present

## 2012-05-10 DIAGNOSIS — M26609 Unspecified temporomandibular joint disorder, unspecified side: Secondary | ICD-10-CM | POA: Diagnosis not present

## 2012-05-12 DIAGNOSIS — R293 Abnormal posture: Secondary | ICD-10-CM | POA: Diagnosis not present

## 2012-05-12 DIAGNOSIS — M2560 Stiffness of unspecified joint, not elsewhere classified: Secondary | ICD-10-CM | POA: Diagnosis not present

## 2012-05-12 DIAGNOSIS — M62838 Other muscle spasm: Secondary | ICD-10-CM | POA: Diagnosis not present

## 2012-05-12 DIAGNOSIS — M26609 Unspecified temporomandibular joint disorder, unspecified side: Secondary | ICD-10-CM | POA: Diagnosis not present

## 2012-05-12 DIAGNOSIS — R6884 Jaw pain: Secondary | ICD-10-CM | POA: Diagnosis not present

## 2012-05-12 DIAGNOSIS — IMO0001 Reserved for inherently not codable concepts without codable children: Secondary | ICD-10-CM | POA: Diagnosis not present

## 2012-05-20 DIAGNOSIS — R6884 Jaw pain: Secondary | ICD-10-CM | POA: Diagnosis not present

## 2012-05-20 DIAGNOSIS — M26609 Unspecified temporomandibular joint disorder, unspecified side: Secondary | ICD-10-CM | POA: Diagnosis not present

## 2012-05-20 DIAGNOSIS — R293 Abnormal posture: Secondary | ICD-10-CM | POA: Diagnosis not present

## 2012-05-20 DIAGNOSIS — IMO0001 Reserved for inherently not codable concepts without codable children: Secondary | ICD-10-CM | POA: Diagnosis not present

## 2012-05-20 DIAGNOSIS — M2560 Stiffness of unspecified joint, not elsewhere classified: Secondary | ICD-10-CM | POA: Diagnosis not present

## 2012-05-20 DIAGNOSIS — M62838 Other muscle spasm: Secondary | ICD-10-CM | POA: Diagnosis not present

## 2012-05-25 DIAGNOSIS — I10 Essential (primary) hypertension: Secondary | ICD-10-CM | POA: Diagnosis not present

## 2012-05-25 DIAGNOSIS — Z6827 Body mass index (BMI) 27.0-27.9, adult: Secondary | ICD-10-CM | POA: Diagnosis not present

## 2012-05-25 DIAGNOSIS — E039 Hypothyroidism, unspecified: Secondary | ICD-10-CM | POA: Diagnosis not present

## 2012-05-25 DIAGNOSIS — E785 Hyperlipidemia, unspecified: Secondary | ICD-10-CM | POA: Diagnosis not present

## 2012-05-25 DIAGNOSIS — I4891 Unspecified atrial fibrillation: Secondary | ICD-10-CM | POA: Diagnosis not present

## 2012-06-02 DIAGNOSIS — Z7901 Long term (current) use of anticoagulants: Secondary | ICD-10-CM | POA: Diagnosis not present

## 2012-06-02 DIAGNOSIS — I4891 Unspecified atrial fibrillation: Secondary | ICD-10-CM | POA: Diagnosis not present

## 2012-06-16 DIAGNOSIS — Z7901 Long term (current) use of anticoagulants: Secondary | ICD-10-CM | POA: Diagnosis not present

## 2012-06-16 DIAGNOSIS — I10 Essential (primary) hypertension: Secondary | ICD-10-CM | POA: Diagnosis not present

## 2012-06-16 DIAGNOSIS — E785 Hyperlipidemia, unspecified: Secondary | ICD-10-CM | POA: Diagnosis not present

## 2012-06-16 DIAGNOSIS — I4891 Unspecified atrial fibrillation: Secondary | ICD-10-CM | POA: Diagnosis not present

## 2012-06-16 DIAGNOSIS — I498 Other specified cardiac arrhythmias: Secondary | ICD-10-CM | POA: Diagnosis not present

## 2012-06-16 DIAGNOSIS — R002 Palpitations: Secondary | ICD-10-CM | POA: Diagnosis not present

## 2012-06-20 DIAGNOSIS — M26629 Arthralgia of temporomandibular joint, unspecified side: Secondary | ICD-10-CM | POA: Diagnosis not present

## 2012-06-20 DIAGNOSIS — M542 Cervicalgia: Secondary | ICD-10-CM | POA: Diagnosis not present

## 2012-06-22 DIAGNOSIS — R002 Palpitations: Secondary | ICD-10-CM | POA: Diagnosis not present

## 2012-06-23 DIAGNOSIS — M26629 Arthralgia of temporomandibular joint, unspecified side: Secondary | ICD-10-CM | POA: Diagnosis not present

## 2012-06-23 DIAGNOSIS — M542 Cervicalgia: Secondary | ICD-10-CM | POA: Diagnosis not present

## 2012-06-27 DIAGNOSIS — M26629 Arthralgia of temporomandibular joint, unspecified side: Secondary | ICD-10-CM | POA: Diagnosis not present

## 2012-06-27 DIAGNOSIS — M542 Cervicalgia: Secondary | ICD-10-CM | POA: Diagnosis not present

## 2012-06-28 DIAGNOSIS — R51 Headache: Secondary | ICD-10-CM | POA: Diagnosis not present

## 2012-06-28 DIAGNOSIS — I6529 Occlusion and stenosis of unspecified carotid artery: Secondary | ICD-10-CM | POA: Diagnosis not present

## 2012-06-28 DIAGNOSIS — M542 Cervicalgia: Secondary | ICD-10-CM | POA: Diagnosis not present

## 2012-06-30 DIAGNOSIS — M542 Cervicalgia: Secondary | ICD-10-CM | POA: Diagnosis not present

## 2012-06-30 DIAGNOSIS — I4891 Unspecified atrial fibrillation: Secondary | ICD-10-CM | POA: Diagnosis not present

## 2012-06-30 DIAGNOSIS — M26629 Arthralgia of temporomandibular joint, unspecified side: Secondary | ICD-10-CM | POA: Diagnosis not present

## 2012-06-30 DIAGNOSIS — Z7901 Long term (current) use of anticoagulants: Secondary | ICD-10-CM | POA: Diagnosis not present

## 2012-07-04 DIAGNOSIS — M26629 Arthralgia of temporomandibular joint, unspecified side: Secondary | ICD-10-CM | POA: Diagnosis not present

## 2012-07-04 DIAGNOSIS — M542 Cervicalgia: Secondary | ICD-10-CM | POA: Diagnosis not present

## 2012-07-14 DIAGNOSIS — M542 Cervicalgia: Secondary | ICD-10-CM | POA: Diagnosis not present

## 2012-07-14 DIAGNOSIS — M26629 Arthralgia of temporomandibular joint, unspecified side: Secondary | ICD-10-CM | POA: Diagnosis not present

## 2012-07-21 DIAGNOSIS — R002 Palpitations: Secondary | ICD-10-CM | POA: Diagnosis not present

## 2012-07-29 DIAGNOSIS — Z7901 Long term (current) use of anticoagulants: Secondary | ICD-10-CM | POA: Diagnosis not present

## 2012-07-29 DIAGNOSIS — I4891 Unspecified atrial fibrillation: Secondary | ICD-10-CM | POA: Diagnosis not present

## 2012-08-11 DIAGNOSIS — I4891 Unspecified atrial fibrillation: Secondary | ICD-10-CM | POA: Diagnosis not present

## 2012-08-26 DIAGNOSIS — Z7901 Long term (current) use of anticoagulants: Secondary | ICD-10-CM | POA: Diagnosis not present

## 2012-08-26 DIAGNOSIS — R002 Palpitations: Secondary | ICD-10-CM | POA: Diagnosis not present

## 2012-08-26 DIAGNOSIS — I4891 Unspecified atrial fibrillation: Secondary | ICD-10-CM | POA: Diagnosis not present

## 2012-09-01 DIAGNOSIS — J209 Acute bronchitis, unspecified: Secondary | ICD-10-CM | POA: Diagnosis not present

## 2012-09-01 DIAGNOSIS — Z6828 Body mass index (BMI) 28.0-28.9, adult: Secondary | ICD-10-CM | POA: Diagnosis not present

## 2012-09-13 DIAGNOSIS — I10 Essential (primary) hypertension: Secondary | ICD-10-CM | POA: Diagnosis not present

## 2012-09-13 DIAGNOSIS — I4891 Unspecified atrial fibrillation: Secondary | ICD-10-CM | POA: Diagnosis not present

## 2012-09-13 DIAGNOSIS — Z6827 Body mass index (BMI) 27.0-27.9, adult: Secondary | ICD-10-CM | POA: Diagnosis not present

## 2012-09-13 DIAGNOSIS — E039 Hypothyroidism, unspecified: Secondary | ICD-10-CM | POA: Diagnosis not present

## 2012-09-13 DIAGNOSIS — E785 Hyperlipidemia, unspecified: Secondary | ICD-10-CM | POA: Diagnosis not present

## 2012-09-13 DIAGNOSIS — N390 Urinary tract infection, site not specified: Secondary | ICD-10-CM | POA: Diagnosis not present

## 2012-09-19 DIAGNOSIS — Z1231 Encounter for screening mammogram for malignant neoplasm of breast: Secondary | ICD-10-CM | POA: Diagnosis not present

## 2012-09-19 DIAGNOSIS — M949 Disorder of cartilage, unspecified: Secondary | ICD-10-CM | POA: Diagnosis not present

## 2012-09-19 DIAGNOSIS — M899 Disorder of bone, unspecified: Secondary | ICD-10-CM | POA: Diagnosis not present

## 2012-09-19 DIAGNOSIS — N951 Menopausal and female climacteric states: Secondary | ICD-10-CM | POA: Diagnosis not present

## 2012-09-27 DIAGNOSIS — Z7901 Long term (current) use of anticoagulants: Secondary | ICD-10-CM | POA: Diagnosis not present

## 2012-09-27 DIAGNOSIS — I4891 Unspecified atrial fibrillation: Secondary | ICD-10-CM | POA: Diagnosis not present

## 2012-10-05 DIAGNOSIS — I4891 Unspecified atrial fibrillation: Secondary | ICD-10-CM | POA: Diagnosis not present

## 2012-10-05 DIAGNOSIS — E785 Hyperlipidemia, unspecified: Secondary | ICD-10-CM | POA: Diagnosis not present

## 2012-10-05 DIAGNOSIS — I10 Essential (primary) hypertension: Secondary | ICD-10-CM | POA: Diagnosis not present

## 2012-10-10 DIAGNOSIS — Z7901 Long term (current) use of anticoagulants: Secondary | ICD-10-CM | POA: Diagnosis not present

## 2012-10-10 DIAGNOSIS — I4891 Unspecified atrial fibrillation: Secondary | ICD-10-CM | POA: Diagnosis not present

## 2012-10-17 DIAGNOSIS — Z7901 Long term (current) use of anticoagulants: Secondary | ICD-10-CM | POA: Diagnosis not present

## 2012-10-17 DIAGNOSIS — I4891 Unspecified atrial fibrillation: Secondary | ICD-10-CM | POA: Diagnosis not present

## 2012-10-24 DIAGNOSIS — Z7901 Long term (current) use of anticoagulants: Secondary | ICD-10-CM | POA: Diagnosis not present

## 2012-10-24 DIAGNOSIS — N816 Rectocele: Secondary | ICD-10-CM | POA: Diagnosis not present

## 2012-10-24 DIAGNOSIS — N949 Unspecified condition associated with female genital organs and menstrual cycle: Secondary | ICD-10-CM | POA: Diagnosis not present

## 2012-10-24 DIAGNOSIS — I4891 Unspecified atrial fibrillation: Secondary | ICD-10-CM | POA: Diagnosis not present

## 2012-10-24 DIAGNOSIS — R300 Dysuria: Secondary | ICD-10-CM | POA: Diagnosis not present

## 2012-11-07 DIAGNOSIS — I10 Essential (primary) hypertension: Secondary | ICD-10-CM | POA: Diagnosis not present

## 2012-11-07 DIAGNOSIS — E785 Hyperlipidemia, unspecified: Secondary | ICD-10-CM | POA: Diagnosis not present

## 2012-11-07 DIAGNOSIS — I4891 Unspecified atrial fibrillation: Secondary | ICD-10-CM | POA: Diagnosis not present

## 2012-11-07 DIAGNOSIS — I130 Hypertensive heart and chronic kidney disease with heart failure and stage 1 through stage 4 chronic kidney disease, or unspecified chronic kidney disease: Secondary | ICD-10-CM | POA: Diagnosis not present

## 2012-11-07 DIAGNOSIS — I509 Heart failure, unspecified: Secondary | ICD-10-CM | POA: Diagnosis not present

## 2012-11-07 DIAGNOSIS — N189 Chronic kidney disease, unspecified: Secondary | ICD-10-CM | POA: Diagnosis not present

## 2012-12-05 DIAGNOSIS — I4891 Unspecified atrial fibrillation: Secondary | ICD-10-CM | POA: Diagnosis not present

## 2012-12-05 DIAGNOSIS — Z7901 Long term (current) use of anticoagulants: Secondary | ICD-10-CM | POA: Diagnosis not present

## 2012-12-06 ENCOUNTER — Other Ambulatory Visit: Payer: Self-pay | Admitting: Urology

## 2012-12-06 DIAGNOSIS — R300 Dysuria: Secondary | ICD-10-CM | POA: Diagnosis not present

## 2012-12-06 DIAGNOSIS — N35919 Unspecified urethral stricture, male, unspecified site: Secondary | ICD-10-CM | POA: Diagnosis not present

## 2012-12-06 DIAGNOSIS — N952 Postmenopausal atrophic vaginitis: Secondary | ICD-10-CM | POA: Diagnosis not present

## 2012-12-21 ENCOUNTER — Encounter (HOSPITAL_BASED_OUTPATIENT_CLINIC_OR_DEPARTMENT_OTHER): Payer: Self-pay | Admitting: *Deleted

## 2012-12-21 NOTE — Progress Notes (Signed)
NPO AFTER MN. ARRIVES AT 0745. NEEDS PT/INR, BMET, AND HG. CURRENT EKG , LOV NOTE TO BE FAXED FROM DR Dulce Sellar [phone]).  WILL TAKE SOTALOL AND SYNTHROID AM OF SURG W/ SIPS OF WATER. ALSO, WILL BRING CARDIZEM DOSE, NORMALLY TAKE AT 1030AM,  SINCE SHE HAS TO DRINK A LOT OF WATER W/ IT.

## 2012-12-22 NOTE — H&P (Signed)
History of Present Illness                   History of cystocele: She was seen for this by Dr. Saddie Benders who initially recommended a pessary. She was unable to tolerate this as she said she could feel it and it also fell out and caused an odor. She did not have any stress or urge incontinence. She did have some urinary frequency and noted her stream seemed slower. This was associated with nocturia 1-3 times. She underwent urodynamics and was found to have stress urinary incontinence when her cystocele was reduced. On 09/08/10 she underwent anterior repair and mid urethral sling. This resolved her cystocele and stress incontinence.   In addition she has had an occasional UTI although chronic UTIs or not a major problem for her. She has reported to me in the past experiencing terminal dysuria. She does take Coumadin but has stopped this previously.  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 I prescribed topical estrogen applied to the urethral meatal and introital region. She was recently diagnosed with UTI and treated with appropriate antibiotics. A followup urine culture was found to be negative. Her dysuria occurs with each voiding and she indicates that will last for about 3-5 minutes after she urinates. It is significant enough that she feels she needs to bend over and actually hold the perineal region. She's tried Elmiron for a week without any improvement and also has been placed on a course of metronidazole without improvement. She also tried topical estrogen without improvement. She denies any hematuria.    Past Medical History Problems  1. History of  Atrial Fibrillation 427.31 2. History of  Cystocele 618.00 3. History of  Female Stress Incontinence 625.6 4. History of  Hypercholesterolemia 272.0 5. History of  Hypertension 401.9 6. History of  Hypothyroidism 244.9  Surgical History Problems  1. History of  Anterior Colporrhaphy, Repair Of  Cystocele 2. History of  Hysterectomy V45.77 3. History of  Inguinal Hernia Repair 4. History of  Tubal Ligation V25.2 5. History of  Vaginal Sling Operation For Stress Incontinence  Current Meds 1. Calcium TABS; Therapy: (Recorded:24Oct2011) to 2. Diltiazem HCl ER Coated Beads 180 MG Oral Capsule Extended Release 24 Hour; Therapy:  16Dec2010 to 3. GNP Fish Oil CAPS; Therapy: (Recorded:24Oct2011) to 4. Levothyroxine Sodium 75 MCG Oral Tablet; Therapy: (Recorded:02Aug2012) to 5. Losartan Potassium 100 MG Oral Tablet; Therapy: 09Sep2011 to 6. Magnesium TABS; Therapy: (Recorded:24Oct2011) to 7. MiraLax Oral Packet; Therapy: (Recorded:24Oct2011) to 8. Pantoprazole Sodium 40 MG Oral Tablet Delayed Release; Therapy: 24May2012 to 9. Premarin 0.625 MG/GM Vaginal Cream; USE AS DIRECTED; Therapy: 02Aug2012 to (Last  Rx:02Aug2012)  Requested for: 02Aug2012 10. Simvastatin 40 MG Oral Tablet; Therapy: 16Dec2010 to 11. Sinus & Allergy 12 Hour TB12; Therapy: (Recorded:24Oct2011) to 12. Tums 500 CHEW; Therapy: (Recorded:24Oct2011) to 13. Tylenol TABS; Therapy: (Recorded:24Oct2011) to 14. Vitamin D TABS; Therapy: (Recorded:24Oct2011) to 15. Warfarin Sodium 1 MG Oral Tablet; Therapy: 16Dec2010 to  Allergies Medication  1. Protamine 2. Amoxicillin CAPS 3. CeleBREX CAPS 4. Clindamycin 5. Fluconazole TABS  Family History Problems  1. Paternal history of  Acute Myocardial Infarction V17.3 2. Family history of  Family Health Status Number Of Children 4 children 3. Paternal history of  Heart Disease V17.49  Social History Problems  1. Caffeine Use 2. Former Smoker V15.82 3. Marital History - Widowed   Review of Systems Genitourinary, constitutional, skin, eye, otolaryngeal, hematologic/lymphatic, cardiovascular, pulmonary, endocrine,  musculoskeletal, gastrointestinal, neurological and psychiatric system(s) were reviewed and pertinent findings if present are noted.  Genitourinary: urinary  frequency, urinary urgency, nocturia and hematuria.  Gastrointestinal: constipation.  Hematologic/Lymphatic: a tendency to easily bruise.    Vitals Vital Signs  Height Weight BMI BSA  Weight: 144 lb  Blood Pressure  Blood Pressure: 144 / 69 Pulse  Heart Rate: 65 Respiration  Respiration: 12  Physical Exam Constitutional: Well nourished and well developed. No acute distress.  ENT:. The ears and nose are normal in appearance.  Neck: The appearance of the neck is normal and no neck mass is present.  Pulmonary: No respiratory distress and normal respiratory rhythm and effort.  Cardiovascular: Heart rate and rhythm are normal. No peripheral edema.  Abdomen: The abdomen is soft and nontender. No masses are palpated. No CVA tenderness. No hernias are palpable. No hepatosplenomegaly noted.  Genitourinary: Examination of the external genitalia shows normal female external genitalia and no lesions. The urethra is normal in appearance and not tender. There is no urethral mass. Vaginal exam demonstrates no abnormalities. A cystocele is present with a midline defect (grade 3 /4). The cervix is is absent. The uterus is absent. The bladder is non tender and not distended. The anus is normal on inspection. The perineum is normal on inspection.  Lymphatics: The femoral and inguinal nodes are not enlarged or tender.  Skin: Normal skin turgor, no visible rash and no visible skin lesions.  Neuro/Psych:. Mood and affect are appropriate.    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 Dysuria 788.1      I proceeded with cystoscopy in order to rule out erosion of her sling as a possible cause of her persistent voiding pain. What I found was what appears to be urethral stricturing. I was unable to pass the scope so I attempted to dilate with [employer] sounds. I met resistance and because she is on Coumadin did not want to try to force the dilation here but rather perform this under anesthesia with  her off her Coumadin. We discussed the procedure in detail including its risks and complications, the probability of success and the outpatient nature of the procedure as well as the anticipated postoperative course. She understands that long-term with the greatest risks, if this is just a stricture, would be recurrence. She understands and has elected to proceed.   Plan    1. She will come off of her Coumadin for 5 days prior to her surgery. 2. I'll plan to perform cystoscopy and urethral dilatation with further evaluation of the urethra under anesthesia as an outpatient. I

## 2012-12-23 ENCOUNTER — Ambulatory Visit (HOSPITAL_BASED_OUTPATIENT_CLINIC_OR_DEPARTMENT_OTHER)
Admission: RE | Admit: 2012-12-23 | Discharge: 2012-12-23 | Disposition: A | Discharge status: Home / Self Care | Payer: Medicare Other | Source: Ambulatory Visit | Attending: Urology | Admitting: Urology

## 2012-12-23 ENCOUNTER — Encounter (HOSPITAL_BASED_OUTPATIENT_CLINIC_OR_DEPARTMENT_OTHER)
Admission: RE | Disposition: A | Discharge status: Home / Self Care | Payer: Self-pay | Source: Ambulatory Visit | Attending: Urology

## 2012-12-23 ENCOUNTER — Ambulatory Visit (HOSPITAL_BASED_OUTPATIENT_CLINIC_OR_DEPARTMENT_OTHER): Payer: Medicare Other | Admitting: Anesthesiology

## 2012-12-23 ENCOUNTER — Encounter (HOSPITAL_BASED_OUTPATIENT_CLINIC_OR_DEPARTMENT_OTHER): Payer: Self-pay | Admitting: Anesthesiology

## 2012-12-23 DIAGNOSIS — E7800 Pure hypercholesterolemia, unspecified: Secondary | ICD-10-CM | POA: Diagnosis not present

## 2012-12-23 DIAGNOSIS — Z87440 Personal history of urinary (tract) infections: Secondary | ICD-10-CM | POA: Diagnosis not present

## 2012-12-23 DIAGNOSIS — N35919 Unspecified urethral stricture, male, unspecified site: Secondary | ICD-10-CM | POA: Insufficient documentation

## 2012-12-23 DIAGNOSIS — N8111 Cystocele, midline: Secondary | ICD-10-CM | POA: Insufficient documentation

## 2012-12-23 DIAGNOSIS — I4891 Unspecified atrial fibrillation: Secondary | ICD-10-CM | POA: Diagnosis not present

## 2012-12-23 DIAGNOSIS — I10 Essential (primary) hypertension: Secondary | ICD-10-CM | POA: Diagnosis not present

## 2012-12-23 DIAGNOSIS — E039 Hypothyroidism, unspecified: Secondary | ICD-10-CM | POA: Diagnosis not present

## 2012-12-23 DIAGNOSIS — Z79899 Other long term (current) drug therapy: Secondary | ICD-10-CM | POA: Insufficient documentation

## 2012-12-23 DIAGNOSIS — N362 Urethral caruncle: Secondary | ICD-10-CM | POA: Insufficient documentation

## 2012-12-23 DIAGNOSIS — Z90710 Acquired absence of both cervix and uterus: Secondary | ICD-10-CM | POA: Insufficient documentation

## 2012-12-23 DIAGNOSIS — R300 Dysuria: Secondary | ICD-10-CM | POA: Diagnosis not present

## 2012-12-23 DIAGNOSIS — N393 Stress incontinence (female) (male): Secondary | ICD-10-CM | POA: Diagnosis not present

## 2012-12-23 DIAGNOSIS — N952 Postmenopausal atrophic vaginitis: Secondary | ICD-10-CM | POA: Diagnosis not present

## 2012-12-23 HISTORY — DX: Essential (primary) hypertension: I10

## 2012-12-23 HISTORY — DX: Long term (current) use of anticoagulants: Z79.01

## 2012-12-23 HISTORY — DX: Unspecified urethral stricture, male, unspecified site: N35.919

## 2012-12-23 HISTORY — DX: Hyperlipidemia, unspecified: E78.5

## 2012-12-23 HISTORY — DX: Other specified postprocedural states: Z98.890

## 2012-12-23 HISTORY — PX: CYSTOSCOPY WITH URETHRAL DILATATION: SHX5125

## 2012-12-23 HISTORY — DX: Gastro-esophageal reflux disease without esophagitis: K21.9

## 2012-12-23 HISTORY — DX: Personal history of other diseases of the circulatory system: Z86.79

## 2012-12-23 HISTORY — DX: Paroxysmal atrial fibrillation: I48.0

## 2012-12-23 HISTORY — DX: Hypothyroidism, unspecified: E03.9

## 2012-12-23 LAB — BASIC METABOLIC PANEL
BUN: 10 mg/dL (ref 6–23)
CO2: 24 mEq/L (ref 19–32)
Chloride: 98 mEq/L (ref 96–112)
Creatinine, Ser: 0.7 mg/dL (ref 0.50–1.10)
GFR calc Af Amer: >90 mL/min (ref >90)
Potassium: 4 mEq/L (ref 3.5–5.1)

## 2012-12-23 LAB — PROTIME-INR
INR: 0.97 (ref 0.00–1.49)
Prothrombin Time: 12.8 seconds (ref 11.6–15.2)

## 2012-12-23 LAB — POCT HEMOGLOBIN-HEMACUE: Hemoglobin: 13.9 g/dL (ref 12.0–15.0)

## 2012-12-23 SURGERY — CYSTOSCOPY, WITH URETHRAL DILATION
Anesthesia: General | Site: Urethra | Wound class: Clean Contaminated

## 2012-12-23 MED ORDER — PROMETHAZINE HCL 25 MG/ML IJ SOLN
6.2500 mg | INTRAMUSCULAR | Status: DC | PRN
  Filled 2012-12-23: qty 1

## 2012-12-23 MED ORDER — FENTANYL CITRATE 0.05 MG/ML IJ SOLN
INTRAMUSCULAR | Status: DC | PRN
  Administered 2012-12-23: 50 ug via INTRAVENOUS

## 2012-12-23 MED ORDER — HYDROCODONE-ACETAMINOPHEN 7.5-325 MG PO TABS: 1.0000 | ORAL_TABLET | ORAL | Status: DC | PRN

## 2012-12-23 MED ORDER — DEXAMETHASONE SODIUM PHOSPHATE 4 MG/ML IJ SOLN
INTRAMUSCULAR | Status: DC | PRN
  Administered 2012-12-23: 4 mg via INTRAVENOUS

## 2012-12-23 MED ORDER — ACETAMINOPHEN 10 MG/ML IV SOLN
1000.0000 mg | Freq: Once | INTRAVENOUS | Status: DC | PRN
  Filled 2012-12-23: qty 100

## 2012-12-23 MED ORDER — MEPERIDINE HCL 25 MG/ML IJ SOLN
6.2500 mg | INTRAMUSCULAR | Status: DC | PRN
  Filled 2012-12-23: qty 1

## 2012-12-23 MED ORDER — LIDOCAINE HCL (CARDIAC) 20 MG/ML IV SOLN
INTRAVENOUS | Status: DC | PRN
  Administered 2012-12-23: 60 mg via INTRAVENOUS

## 2012-12-23 MED ORDER — STERILE WATER FOR IRRIGATION IR SOLN
Status: DC | PRN
  Administered 2012-12-23: 3000 mL

## 2012-12-23 MED ORDER — PHENAZOPYRIDINE HCL 200 MG PO TABS: 200.0000 mg | ORAL_TABLET | Freq: Three times a day (TID) | ORAL | Status: DC | PRN

## 2012-12-23 MED ORDER — OXYCODONE HCL 5 MG PO TABS
5.0000 mg | ORAL_TABLET | Freq: Once | ORAL | Status: DC | PRN
  Filled 2012-12-23: qty 1

## 2012-12-23 MED ORDER — MIDAZOLAM HCL 5 MG/5ML IJ SOLN
INTRAMUSCULAR | Status: DC | PRN
  Administered 2012-12-23: 1 mg via INTRAVENOUS

## 2012-12-23 MED ORDER — IOHEXOL 350 MG/ML SOLN
INTRAVENOUS | Status: DC | PRN
  Administered 2012-12-23: 5 mL

## 2012-12-23 MED ORDER — PROPOFOL 10 MG/ML IV BOLUS
INTRAVENOUS | Status: DC | PRN
  Administered 2012-12-23: 150 mg via INTRAVENOUS

## 2012-12-23 MED ORDER — HYDROMORPHONE HCL PF 1 MG/ML IJ SOLN
0.2500 mg | INTRAMUSCULAR | Status: DC | PRN
  Filled 2012-12-23: qty 1

## 2012-12-23 MED ORDER — OXYCODONE HCL 5 MG/5ML PO SOLN
5.0000 mg | Freq: Once | ORAL | Status: DC | PRN
  Filled 2012-12-23: qty 5

## 2012-12-23 MED ORDER — LACTATED RINGERS IV SOLN
INTRAVENOUS | Status: DC
  Administered 2012-12-23: 100 mL/h via INTRAVENOUS
  Filled 2012-12-23: qty 1000

## 2012-12-23 MED ORDER — CIPROFLOXACIN IN D5W 200 MG/100ML IV SOLN
200.0000 mg | INTRAVENOUS | Status: AC
  Administered 2012-12-23 (×2): 200 mg via INTRAVENOUS
  Filled 2012-12-23: qty 100

## 2012-12-23 SURGICAL SUPPLY — 27 items
BAG DRAIN URO-CYSTO SKYTR STRL (DRAIN) ×2 IMPLANT
BAG DRN UROCATH (DRAIN) ×1
BALLN NEPHROSTOMY (BALLOONS) ×2
BALLOON NEPHROSTOMY (BALLOONS) ×1 IMPLANT
CANISTER SUCT LVC 12 LTR MEDI- (MISCELLANEOUS) ×1 IMPLANT
CATH FOLEY 2WAY SLVR  5CC 16FR (CATHETERS)
CATH FOLEY 2WAY SLVR 5CC 16FR (CATHETERS) IMPLANT
CLOTH BEACON ORANGE TIMEOUT ST (SAFETY) ×2 IMPLANT
DRAPE CAMERA CLOSED 9X96 (DRAPES) ×2 IMPLANT
ELECT REM PT RETURN 9FT ADLT (ELECTROSURGICAL) ×2
ELECTRODE REM PT RTRN 9FT ADLT (ELECTROSURGICAL) ×1 IMPLANT
GLOVE BIO SURGEON STRL SZ8 (GLOVE) ×2 IMPLANT
GLOVE ECLIPSE 6.5 STRL STRAW (GLOVE) ×1 IMPLANT
GLOVE INDICATOR 6.5 STRL GRN (GLOVE) ×1 IMPLANT
GLOVE INDICATOR 7.5 STRL GRN (GLOVE) ×1 IMPLANT
GOWN PREVENTION PLUS LG XLONG (DISPOSABLE) ×1 IMPLANT
GOWN STRL REIN XL XLG (GOWN DISPOSABLE) ×2 IMPLANT
GOWN XL W/COTTON TOWEL STD (GOWNS) ×2 IMPLANT
GUIDEWIRE STR DUAL SENSOR (WIRE) ×1 IMPLANT
NDL SAFETY ECLIPSE 18X1.5 (NEEDLE) IMPLANT
NEEDLE HYPO 18GX1.5 SHARP (NEEDLE)
NEEDLE HYPO 22GX1.5 SAFETY (NEEDLE) IMPLANT
NS IRRIG 500ML POUR BTL (IV SOLUTION) IMPLANT
PACK CYSTOSCOPY (CUSTOM PROCEDURE TRAY) ×2 IMPLANT
SYR 20CC LL (SYRINGE) IMPLANT
SYR 30ML LL (SYRINGE) IMPLANT
WATER STERILE IRR 3000ML UROMA (IV SOLUTION) ×3 IMPLANT

## 2012-12-23 NOTE — Anesthesia Postprocedure Evaluation (Signed)
Anesthesia Post Note  Patient: [name]  Procedure(s) Performed: Procedure(s) (LRB): CYSTOSCOPY WITH BALLOON DILATATION (N/A)  Anesthesia type: General  Patient location: PACU  Post pain: Pain level controlled  Post assessment: Post-op Vital signs reviewed  Last Vitals: BP 138/69  Pulse 53  Temp(Src) 36.3 C (Oral)  Resp 16  Ht 5\' 2"  (1.575 m)  Wt 147 lb (66.679 kg)  BMI 26.88 kg/m2  SpO2 97%  Post vital signs: Reviewed  Level of consciousness: sedated  Complications: No apparent anesthesia complications

## 2012-12-23 NOTE — Anesthesia Procedure Notes (Signed)
Procedure Name: LMA Insertion Date/Time: 12/23/2012 9:44 AM Performed by: Burna Cash Pre-anesthesia Checklist: Patient identified, Emergency Drugs available, Suction available and Patient being monitored Patient Re-evaluated:Patient Re-evaluated prior to inductionOxygen Delivery Method: Circle System Utilized Preoxygenation: Pre-oxygenation with 100% oxygen Intubation Type: IV induction Ventilation: Mask ventilation without difficulty LMA: LMA inserted LMA Size: 4.0 Number of attempts: 1 Airway Equipment and Method: bite block Placement Confirmation: positive ETCO2 Tube secured with: Tape Dental Injury: Teeth and Oropharynx as per pre-operative assessment

## 2012-12-23 NOTE — Anesthesia Preprocedure Evaluation (Addendum)
Anesthesia Evaluation  Patient identified by MRN, date of birth, ID band Patient awake    Reviewed: Allergy & Precautions, H&P , NPO status , Patient's Chart, lab work & pertinent test results, reviewed documented beta blocker date and time   Airway Mallampati: II TM Distance: >3 FB Neck ROM: Full    Dental  (+) Dental Advisory Given, Teeth Intact and Caps   Pulmonary neg pulmonary ROS,  breath sounds clear to auscultation        Cardiovascular hypertension, Pt. on medications and Pt. on home beta blockers + CAD + dysrhythmias Atrial Fibrillation Rhythm:Regular Rate:Normal  Non obstructive CAD on cath   Neuro/Psych negative neurological ROS  negative psych ROS   GI/Hepatic Neg liver ROS, GERD-  Medicated,  Endo/Other  Hypothyroidism   Renal/GU negative Renal ROS     Musculoskeletal negative musculoskeletal ROS (+)   Abdominal   Peds  Hematology negative hematology ROS (+)   Anesthesia Other Findings   Reproductive/Obstetrics negative OB ROS                         Anesthesia Physical Anesthesia Plan  ASA: III  Anesthesia Plan: General   Post-op Pain Management:    Induction: Intravenous  Airway Management Planned: LMA  Additional Equipment:   Intra-op Plan:   Post-operative Plan: Extubation in OR  Informed Consent: I have reviewed the patients History and Physical, chart, labs and discussed the procedure including the risks, benefits and alternatives for the proposed anesthesia with the patient or authorized representative who has indicated his/her understanding and acceptance.   Dental advisory given  Plan Discussed with: CRNA  Anesthesia Plan Comments:         Anesthesia Quick Evaluation

## 2012-12-23 NOTE — Transfer of Care (Signed)
Immediate Anesthesia Transfer of Care Note  Patient: [name]  Procedure(s) Performed: Procedure(s): CYSTOSCOPY WITH BALLOON DILATATION (N/A)  Patient Location: PACU  Anesthesia Type:General  Level of Consciousness: awake, alert  and oriented  Airway & Oxygen Therapy: Patient Spontanous Breathing and Patient connected to face mask oxygen  Post-op Assessment: Report given to PACU RN and Post -op Vital signs reviewed and stable  Post vital signs: Reviewed and stable  Complications: No apparent anesthesia complications

## 2012-12-23 NOTE — Interval H&P Note (Signed)
History and Physical Interval Note:  12/23/2012 9:37 AM  [name]  has presented today for surgery, with the diagnosis of URETHRAL STRICTURE  The various methods of treatment have been discussed with the patient and family. After consideration of risks, benefits and other options for treatment, the patient has consented to  Procedure(s): CYSTOSCOPY WITH BALLOON DILATATION (N/A) as a surgical intervention .  The patient's history has been reviewed, patient examined, no change in status, stable for surgery.  I have reviewed the patient's chart and labs.  Questions were answered to the patient's satisfaction.     Garnett Farm

## 2012-12-23 NOTE — Op Note (Signed)
PATIENT:  [name]  PRE-OPERATIVE DIAGNOSIS: 1. Urethral stricture. 2. Rule out sling erosion.  POST-OPERATIVE DIAGNOSIS: 1. Meatal stricture. 2. No evidence of sling erosion  PROCEDURE: 1. Urethral dilatation 2. Cystoscopy  SURGEON:  Garnett Farm  INDICATION: [name] is a 72 year old female who had a mid urethral sling placed in 1/12. She had been experiencing tissue area and urinary frequency with a pain associated with voiding that lasts for 3-5 minutes after urinating and is significant enough to cause her to bend over and hold her perineal region. An attempt at cystoscopic evaluation in my office was unsuccessful due to to what was felt to be a possible urethral stricture and she is brought to the operating room for further evaluation of this and other possible causes for her dysuria.  ANESTHESIA:  General  EBL:  Minimal  DRAINS: None  LOCAL MEDICATIONS USED:  None  SPECIMEN: None   Description of procedure: After informed consent the patient was brought to the major or, placed on the table and administered general anesthesia. She was then moved to the dorsal lithotomy position and her genitalia sterilely prepped and draped. An official timeout was then performed.  I initially placed the 22 French rigid cystoscope with 12 lens at the urethral meatus. She had a very small urethral caruncle but I did not feel that this was resulting in obstruction in any way. I tried to visualize the urethral meatus with the scope but was unsuccessful.  I chose a 0.038 inch floppy-tipped guidewire and passed this easily through the urethral meatus and into the bladder. Over the guidewire I perform urethral dilatation using a UroMax nephrostomy dilating balloon. After performing this I left the guidewire in place and repeated cystoscopy. I was easily able to pass the scope into the bladder and I evaluated the urethra as the scope was passed. I noted no evidence of sling erosion or other  abnormality and the mid and proximal urethra were noted to be entirely normal. Upon entering the bladder I noted one plus trabeculation. The bladder was then fully and systematically inspected. Ureteral orifices were noted to be of normal configuration and position. There were no tumors, stones or inflammatory lesions found within the bladder. I therefore removed the cystoscope and dilated further using female sounds up to 30 [place] without difficulty. I then reinspected the urethra both with the cystoscope and also used a nasal speculum to view the very distal urethra/meatus and noted it was free of any foreign body or sling material. The urethra was again inspected cystoscopically and again found to be normal in appearance except at the meatus where it had been dilated slightly.  I elected to not use a Foley catheter and drained the bladder. The patient was then awakened and taken recovery room in stable and satisfactory condition. She tolerated the procedure well no intraoperative complications.  PLAN OF CARE: Discharge to home after PACU  PATIENT DISPOSITION:  PACU - hemodynamically stable.

## 2012-12-26 ENCOUNTER — Encounter (HOSPITAL_BASED_OUTPATIENT_CLINIC_OR_DEPARTMENT_OTHER): Payer: Self-pay | Admitting: Urology

## 2012-12-26 DIAGNOSIS — E039 Hypothyroidism, unspecified: Secondary | ICD-10-CM | POA: Diagnosis not present

## 2012-12-26 DIAGNOSIS — I4891 Unspecified atrial fibrillation: Secondary | ICD-10-CM | POA: Diagnosis not present

## 2012-12-26 DIAGNOSIS — E785 Hyperlipidemia, unspecified: Secondary | ICD-10-CM | POA: Diagnosis not present

## 2012-12-26 DIAGNOSIS — Z6828 Body mass index (BMI) 28.0-28.9, adult: Secondary | ICD-10-CM | POA: Diagnosis not present

## 2012-12-26 DIAGNOSIS — Z9181 History of falling: Secondary | ICD-10-CM | POA: Diagnosis not present

## 2012-12-26 DIAGNOSIS — I10 Essential (primary) hypertension: Secondary | ICD-10-CM | POA: Diagnosis not present

## 2012-12-26 DIAGNOSIS — Z1331 Encounter for screening for depression: Secondary | ICD-10-CM | POA: Diagnosis not present

## 2012-12-30 DIAGNOSIS — R300 Dysuria: Secondary | ICD-10-CM | POA: Diagnosis not present

## 2012-12-30 DIAGNOSIS — N35919 Unspecified urethral stricture, male, unspecified site: Secondary | ICD-10-CM | POA: Diagnosis not present

## 2013-01-04 DIAGNOSIS — Z7901 Long term (current) use of anticoagulants: Secondary | ICD-10-CM | POA: Diagnosis not present

## 2013-01-04 DIAGNOSIS — I4891 Unspecified atrial fibrillation: Secondary | ICD-10-CM | POA: Diagnosis not present

## 2013-01-05 DIAGNOSIS — L820 Inflamed seborrheic keratosis: Secondary | ICD-10-CM | POA: Diagnosis not present

## 2013-01-05 DIAGNOSIS — L821 Other seborrheic keratosis: Secondary | ICD-10-CM | POA: Diagnosis not present

## 2013-01-05 DIAGNOSIS — L578 Other skin changes due to chronic exposure to nonionizing radiation: Secondary | ICD-10-CM | POA: Diagnosis not present

## 2013-01-05 DIAGNOSIS — L570 Actinic keratosis: Secondary | ICD-10-CM | POA: Diagnosis not present

## 2013-02-01 DIAGNOSIS — Z7901 Long term (current) use of anticoagulants: Secondary | ICD-10-CM | POA: Diagnosis not present

## 2013-02-01 DIAGNOSIS — I4891 Unspecified atrial fibrillation: Secondary | ICD-10-CM | POA: Diagnosis not present

## 2013-03-28 DIAGNOSIS — N35919 Unspecified urethral stricture, male, unspecified site: Secondary | ICD-10-CM | POA: Diagnosis not present

## 2013-03-28 DIAGNOSIS — R300 Dysuria: Secondary | ICD-10-CM | POA: Diagnosis not present

## 2013-04-05 DIAGNOSIS — E039 Hypothyroidism, unspecified: Secondary | ICD-10-CM | POA: Diagnosis not present

## 2013-04-05 DIAGNOSIS — I10 Essential (primary) hypertension: Secondary | ICD-10-CM | POA: Diagnosis not present

## 2013-04-05 DIAGNOSIS — I4891 Unspecified atrial fibrillation: Secondary | ICD-10-CM | POA: Diagnosis not present

## 2013-04-05 DIAGNOSIS — Z79899 Other long term (current) drug therapy: Secondary | ICD-10-CM | POA: Diagnosis not present

## 2013-04-05 DIAGNOSIS — E785 Hyperlipidemia, unspecified: Secondary | ICD-10-CM | POA: Diagnosis not present

## 2013-04-05 DIAGNOSIS — Z6828 Body mass index (BMI) 28.0-28.9, adult: Secondary | ICD-10-CM | POA: Diagnosis not present

## 2013-04-07 DIAGNOSIS — I4891 Unspecified atrial fibrillation: Secondary | ICD-10-CM | POA: Diagnosis not present

## 2013-04-07 DIAGNOSIS — Z7901 Long term (current) use of anticoagulants: Secondary | ICD-10-CM | POA: Diagnosis not present

## 2013-04-11 DIAGNOSIS — Z7901 Long term (current) use of anticoagulants: Secondary | ICD-10-CM | POA: Diagnosis not present

## 2013-04-11 DIAGNOSIS — I4891 Unspecified atrial fibrillation: Secondary | ICD-10-CM | POA: Diagnosis not present

## 2013-04-18 DIAGNOSIS — I4891 Unspecified atrial fibrillation: Secondary | ICD-10-CM | POA: Diagnosis not present

## 2013-04-18 DIAGNOSIS — Z7901 Long term (current) use of anticoagulants: Secondary | ICD-10-CM | POA: Diagnosis not present

## 2013-04-25 DIAGNOSIS — I4891 Unspecified atrial fibrillation: Secondary | ICD-10-CM | POA: Diagnosis not present

## 2013-04-25 DIAGNOSIS — Z7901 Long term (current) use of anticoagulants: Secondary | ICD-10-CM | POA: Diagnosis not present

## 2013-04-28 DIAGNOSIS — Z6828 Body mass index (BMI) 28.0-28.9, adult: Secondary | ICD-10-CM | POA: Diagnosis not present

## 2013-04-28 DIAGNOSIS — J029 Acute pharyngitis, unspecified: Secondary | ICD-10-CM | POA: Diagnosis not present

## 2013-04-30 DIAGNOSIS — R112 Nausea with vomiting, unspecified: Secondary | ICD-10-CM | POA: Diagnosis not present

## 2013-04-30 DIAGNOSIS — E039 Hypothyroidism, unspecified: Secondary | ICD-10-CM | POA: Diagnosis not present

## 2013-04-30 DIAGNOSIS — I4891 Unspecified atrial fibrillation: Secondary | ICD-10-CM | POA: Diagnosis not present

## 2013-04-30 DIAGNOSIS — J029 Acute pharyngitis, unspecified: Secondary | ICD-10-CM | POA: Diagnosis not present

## 2013-04-30 DIAGNOSIS — K219 Gastro-esophageal reflux disease without esophagitis: Secondary | ICD-10-CM | POA: Diagnosis not present

## 2013-04-30 DIAGNOSIS — E871 Hypo-osmolality and hyponatremia: Secondary | ICD-10-CM | POA: Diagnosis not present

## 2013-04-30 DIAGNOSIS — E785 Hyperlipidemia, unspecified: Secondary | ICD-10-CM | POA: Diagnosis not present

## 2013-04-30 DIAGNOSIS — R5381 Other malaise: Secondary | ICD-10-CM | POA: Diagnosis not present

## 2013-04-30 DIAGNOSIS — R5383 Other fatigue: Secondary | ICD-10-CM | POA: Diagnosis not present

## 2013-05-01 DIAGNOSIS — R112 Nausea with vomiting, unspecified: Secondary | ICD-10-CM | POA: Diagnosis not present

## 2013-05-01 DIAGNOSIS — R5381 Other malaise: Secondary | ICD-10-CM | POA: Diagnosis not present

## 2013-05-01 DIAGNOSIS — E871 Hypo-osmolality and hyponatremia: Secondary | ICD-10-CM | POA: Diagnosis not present

## 2013-05-01 DIAGNOSIS — I4891 Unspecified atrial fibrillation: Secondary | ICD-10-CM | POA: Diagnosis not present

## 2013-05-09 DIAGNOSIS — E871 Hypo-osmolality and hyponatremia: Secondary | ICD-10-CM | POA: Diagnosis not present

## 2013-05-09 DIAGNOSIS — R079 Chest pain, unspecified: Secondary | ICD-10-CM | POA: Diagnosis not present

## 2013-05-09 DIAGNOSIS — Z6827 Body mass index (BMI) 27.0-27.9, adult: Secondary | ICD-10-CM | POA: Diagnosis not present

## 2013-05-09 DIAGNOSIS — R112 Nausea with vomiting, unspecified: Secondary | ICD-10-CM | POA: Diagnosis not present

## 2013-05-10 DIAGNOSIS — I509 Heart failure, unspecified: Secondary | ICD-10-CM | POA: Diagnosis not present

## 2013-05-10 DIAGNOSIS — I130 Hypertensive heart and chronic kidney disease with heart failure and stage 1 through stage 4 chronic kidney disease, or unspecified chronic kidney disease: Secondary | ICD-10-CM | POA: Diagnosis not present

## 2013-05-10 DIAGNOSIS — I4891 Unspecified atrial fibrillation: Secondary | ICD-10-CM | POA: Diagnosis not present

## 2013-05-10 DIAGNOSIS — R002 Palpitations: Secondary | ICD-10-CM | POA: Diagnosis not present

## 2013-05-10 DIAGNOSIS — Z7901 Long term (current) use of anticoagulants: Secondary | ICD-10-CM | POA: Diagnosis not present

## 2013-05-10 DIAGNOSIS — N189 Chronic kidney disease, unspecified: Secondary | ICD-10-CM | POA: Diagnosis not present

## 2013-05-10 DIAGNOSIS — R079 Chest pain, unspecified: Secondary | ICD-10-CM | POA: Diagnosis not present

## 2013-05-19 DIAGNOSIS — I4891 Unspecified atrial fibrillation: Secondary | ICD-10-CM | POA: Diagnosis not present

## 2013-05-19 DIAGNOSIS — Z7901 Long term (current) use of anticoagulants: Secondary | ICD-10-CM | POA: Diagnosis not present

## 2013-06-02 DIAGNOSIS — Z7901 Long term (current) use of anticoagulants: Secondary | ICD-10-CM | POA: Diagnosis not present

## 2013-06-02 DIAGNOSIS — I4891 Unspecified atrial fibrillation: Secondary | ICD-10-CM | POA: Diagnosis not present

## 2013-06-08 DIAGNOSIS — I4891 Unspecified atrial fibrillation: Secondary | ICD-10-CM | POA: Diagnosis not present

## 2013-06-23 DIAGNOSIS — I4891 Unspecified atrial fibrillation: Secondary | ICD-10-CM | POA: Diagnosis not present

## 2013-06-23 DIAGNOSIS — Z7901 Long term (current) use of anticoagulants: Secondary | ICD-10-CM | POA: Diagnosis not present

## 2013-06-26 DIAGNOSIS — I4891 Unspecified atrial fibrillation: Secondary | ICD-10-CM | POA: Diagnosis not present

## 2013-07-04 DIAGNOSIS — I4891 Unspecified atrial fibrillation: Secondary | ICD-10-CM | POA: Diagnosis not present

## 2013-07-04 DIAGNOSIS — E785 Hyperlipidemia, unspecified: Secondary | ICD-10-CM | POA: Diagnosis not present

## 2013-07-12 DIAGNOSIS — I4891 Unspecified atrial fibrillation: Secondary | ICD-10-CM | POA: Diagnosis not present

## 2013-07-12 DIAGNOSIS — Z23 Encounter for immunization: Secondary | ICD-10-CM | POA: Diagnosis not present

## 2013-07-12 DIAGNOSIS — Z79899 Other long term (current) drug therapy: Secondary | ICD-10-CM | POA: Diagnosis not present

## 2013-07-12 DIAGNOSIS — Z6828 Body mass index (BMI) 28.0-28.9, adult: Secondary | ICD-10-CM | POA: Diagnosis not present

## 2013-07-12 DIAGNOSIS — I10 Essential (primary) hypertension: Secondary | ICD-10-CM | POA: Diagnosis not present

## 2013-07-12 DIAGNOSIS — E039 Hypothyroidism, unspecified: Secondary | ICD-10-CM | POA: Diagnosis not present

## 2013-07-12 DIAGNOSIS — E785 Hyperlipidemia, unspecified: Secondary | ICD-10-CM | POA: Diagnosis not present

## 2013-07-13 DIAGNOSIS — Z23 Encounter for immunization: Secondary | ICD-10-CM | POA: Diagnosis not present

## 2013-07-17 DIAGNOSIS — L820 Inflamed seborrheic keratosis: Secondary | ICD-10-CM | POA: Diagnosis not present

## 2013-07-17 DIAGNOSIS — L821 Other seborrheic keratosis: Secondary | ICD-10-CM | POA: Diagnosis not present

## 2013-07-21 DIAGNOSIS — Z7901 Long term (current) use of anticoagulants: Secondary | ICD-10-CM | POA: Diagnosis not present

## 2013-07-21 DIAGNOSIS — I4891 Unspecified atrial fibrillation: Secondary | ICD-10-CM | POA: Diagnosis not present

## 2013-08-11 DIAGNOSIS — Z7901 Long term (current) use of anticoagulants: Secondary | ICD-10-CM | POA: Diagnosis not present

## 2013-08-11 DIAGNOSIS — I4891 Unspecified atrial fibrillation: Secondary | ICD-10-CM | POA: Diagnosis not present

## 2013-09-04 DIAGNOSIS — Z6828 Body mass index (BMI) 28.0-28.9, adult: Secondary | ICD-10-CM | POA: Diagnosis not present

## 2013-09-04 DIAGNOSIS — K141 Geographic tongue: Secondary | ICD-10-CM | POA: Diagnosis not present

## 2013-09-04 DIAGNOSIS — I872 Venous insufficiency (chronic) (peripheral): Secondary | ICD-10-CM | POA: Diagnosis not present

## 2013-09-08 DIAGNOSIS — Z7901 Long term (current) use of anticoagulants: Secondary | ICD-10-CM | POA: Diagnosis not present

## 2013-09-08 DIAGNOSIS — I4891 Unspecified atrial fibrillation: Secondary | ICD-10-CM | POA: Diagnosis not present

## 2013-09-22 DIAGNOSIS — Z7901 Long term (current) use of anticoagulants: Secondary | ICD-10-CM | POA: Diagnosis not present

## 2013-09-22 DIAGNOSIS — I4891 Unspecified atrial fibrillation: Secondary | ICD-10-CM | POA: Diagnosis not present

## 2013-10-04 DIAGNOSIS — M5137 Other intervertebral disc degeneration, lumbosacral region: Secondary | ICD-10-CM | POA: Diagnosis not present

## 2013-10-04 DIAGNOSIS — M9981 Other biomechanical lesions of cervical region: Secondary | ICD-10-CM | POA: Diagnosis not present

## 2013-10-04 DIAGNOSIS — M999 Biomechanical lesion, unspecified: Secondary | ICD-10-CM | POA: Diagnosis not present

## 2013-10-10 DIAGNOSIS — M999 Biomechanical lesion, unspecified: Secondary | ICD-10-CM | POA: Diagnosis not present

## 2013-10-10 DIAGNOSIS — M5137 Other intervertebral disc degeneration, lumbosacral region: Secondary | ICD-10-CM | POA: Diagnosis not present

## 2013-10-10 DIAGNOSIS — M9981 Other biomechanical lesions of cervical region: Secondary | ICD-10-CM | POA: Diagnosis not present

## 2013-10-12 DIAGNOSIS — R300 Dysuria: Secondary | ICD-10-CM | POA: Diagnosis not present

## 2013-10-12 DIAGNOSIS — N952 Postmenopausal atrophic vaginitis: Secondary | ICD-10-CM | POA: Diagnosis not present

## 2013-10-12 DIAGNOSIS — R3129 Other microscopic hematuria: Secondary | ICD-10-CM | POA: Diagnosis not present

## 2013-10-13 DIAGNOSIS — M9981 Other biomechanical lesions of cervical region: Secondary | ICD-10-CM | POA: Diagnosis not present

## 2013-10-13 DIAGNOSIS — M5137 Other intervertebral disc degeneration, lumbosacral region: Secondary | ICD-10-CM | POA: Diagnosis not present

## 2013-10-13 DIAGNOSIS — M999 Biomechanical lesion, unspecified: Secondary | ICD-10-CM | POA: Diagnosis not present

## 2013-10-16 DIAGNOSIS — M9981 Other biomechanical lesions of cervical region: Secondary | ICD-10-CM | POA: Diagnosis not present

## 2013-10-16 DIAGNOSIS — M999 Biomechanical lesion, unspecified: Secondary | ICD-10-CM | POA: Diagnosis not present

## 2013-10-16 DIAGNOSIS — M5137 Other intervertebral disc degeneration, lumbosacral region: Secondary | ICD-10-CM | POA: Diagnosis not present

## 2013-10-17 DIAGNOSIS — E039 Hypothyroidism, unspecified: Secondary | ICD-10-CM | POA: Diagnosis not present

## 2013-10-17 DIAGNOSIS — I4891 Unspecified atrial fibrillation: Secondary | ICD-10-CM | POA: Diagnosis not present

## 2013-10-17 DIAGNOSIS — E785 Hyperlipidemia, unspecified: Secondary | ICD-10-CM | POA: Diagnosis not present

## 2013-10-17 DIAGNOSIS — I10 Essential (primary) hypertension: Secondary | ICD-10-CM | POA: Diagnosis not present

## 2013-10-17 DIAGNOSIS — Z6828 Body mass index (BMI) 28.0-28.9, adult: Secondary | ICD-10-CM | POA: Diagnosis not present

## 2013-10-17 DIAGNOSIS — Z79899 Other long term (current) drug therapy: Secondary | ICD-10-CM | POA: Diagnosis not present

## 2013-10-18 DIAGNOSIS — M9981 Other biomechanical lesions of cervical region: Secondary | ICD-10-CM | POA: Diagnosis not present

## 2013-10-18 DIAGNOSIS — M999 Biomechanical lesion, unspecified: Secondary | ICD-10-CM | POA: Diagnosis not present

## 2013-10-18 DIAGNOSIS — M5137 Other intervertebral disc degeneration, lumbosacral region: Secondary | ICD-10-CM | POA: Diagnosis not present

## 2013-10-20 DIAGNOSIS — I4891 Unspecified atrial fibrillation: Secondary | ICD-10-CM | POA: Diagnosis not present

## 2013-10-20 DIAGNOSIS — M999 Biomechanical lesion, unspecified: Secondary | ICD-10-CM | POA: Diagnosis not present

## 2013-10-20 DIAGNOSIS — Z7901 Long term (current) use of anticoagulants: Secondary | ICD-10-CM | POA: Diagnosis not present

## 2013-10-20 DIAGNOSIS — M5137 Other intervertebral disc degeneration, lumbosacral region: Secondary | ICD-10-CM | POA: Diagnosis not present

## 2013-10-20 DIAGNOSIS — M9981 Other biomechanical lesions of cervical region: Secondary | ICD-10-CM | POA: Diagnosis not present

## 2013-10-23 DIAGNOSIS — Z1231 Encounter for screening mammogram for malignant neoplasm of breast: Secondary | ICD-10-CM | POA: Diagnosis not present

## 2013-10-30 DIAGNOSIS — R350 Frequency of micturition: Secondary | ICD-10-CM | POA: Diagnosis not present

## 2013-11-01 DIAGNOSIS — N35919 Unspecified urethral stricture, male, unspecified site: Secondary | ICD-10-CM | POA: Diagnosis not present

## 2013-11-01 DIAGNOSIS — R350 Frequency of micturition: Secondary | ICD-10-CM | POA: Diagnosis not present

## 2013-11-03 DIAGNOSIS — Z7901 Long term (current) use of anticoagulants: Secondary | ICD-10-CM | POA: Diagnosis not present

## 2013-11-03 DIAGNOSIS — I4891 Unspecified atrial fibrillation: Secondary | ICD-10-CM | POA: Diagnosis not present

## 2013-11-08 DIAGNOSIS — M999 Biomechanical lesion, unspecified: Secondary | ICD-10-CM | POA: Diagnosis not present

## 2013-11-08 DIAGNOSIS — M9981 Other biomechanical lesions of cervical region: Secondary | ICD-10-CM | POA: Diagnosis not present

## 2013-11-08 DIAGNOSIS — M5137 Other intervertebral disc degeneration, lumbosacral region: Secondary | ICD-10-CM | POA: Diagnosis not present

## 2013-11-15 DIAGNOSIS — M5137 Other intervertebral disc degeneration, lumbosacral region: Secondary | ICD-10-CM | POA: Diagnosis not present

## 2013-11-15 DIAGNOSIS — M999 Biomechanical lesion, unspecified: Secondary | ICD-10-CM | POA: Diagnosis not present

## 2013-11-15 DIAGNOSIS — M9981 Other biomechanical lesions of cervical region: Secondary | ICD-10-CM | POA: Diagnosis not present

## 2013-11-17 DIAGNOSIS — I4891 Unspecified atrial fibrillation: Secondary | ICD-10-CM | POA: Diagnosis not present

## 2013-11-17 DIAGNOSIS — Z7901 Long term (current) use of anticoagulants: Secondary | ICD-10-CM | POA: Diagnosis not present

## 2013-11-22 DIAGNOSIS — Z7901 Long term (current) use of anticoagulants: Secondary | ICD-10-CM | POA: Diagnosis not present

## 2013-11-22 DIAGNOSIS — Z79899 Other long term (current) drug therapy: Secondary | ICD-10-CM | POA: Diagnosis not present

## 2013-11-22 DIAGNOSIS — I10 Essential (primary) hypertension: Secondary | ICD-10-CM | POA: Diagnosis not present

## 2013-11-22 DIAGNOSIS — E785 Hyperlipidemia, unspecified: Secondary | ICD-10-CM | POA: Diagnosis not present

## 2013-11-22 DIAGNOSIS — I4891 Unspecified atrial fibrillation: Secondary | ICD-10-CM | POA: Diagnosis not present

## 2013-12-07 DIAGNOSIS — I4891 Unspecified atrial fibrillation: Secondary | ICD-10-CM | POA: Diagnosis not present

## 2013-12-07 DIAGNOSIS — Z7901 Long term (current) use of anticoagulants: Secondary | ICD-10-CM | POA: Diagnosis not present

## 2013-12-12 DIAGNOSIS — E785 Hyperlipidemia, unspecified: Secondary | ICD-10-CM | POA: Diagnosis not present

## 2013-12-12 DIAGNOSIS — Z6827 Body mass index (BMI) 27.0-27.9, adult: Secondary | ICD-10-CM | POA: Diagnosis not present

## 2013-12-12 DIAGNOSIS — IMO0001 Reserved for inherently not codable concepts without codable children: Secondary | ICD-10-CM | POA: Diagnosis not present

## 2013-12-12 DIAGNOSIS — R7301 Impaired fasting glucose: Secondary | ICD-10-CM | POA: Diagnosis not present

## 2013-12-25 DIAGNOSIS — Z0000 Encounter for general adult medical examination without abnormal findings: Secondary | ICD-10-CM | POA: Diagnosis not present

## 2014-01-04 DIAGNOSIS — I4891 Unspecified atrial fibrillation: Secondary | ICD-10-CM | POA: Diagnosis not present

## 2014-01-04 DIAGNOSIS — Z7901 Long term (current) use of anticoagulants: Secondary | ICD-10-CM | POA: Diagnosis not present

## 2014-01-10 DIAGNOSIS — K219 Gastro-esophageal reflux disease without esophagitis: Secondary | ICD-10-CM | POA: Insufficient documentation

## 2014-01-10 DIAGNOSIS — E039 Hypothyroidism, unspecified: Secondary | ICD-10-CM | POA: Insufficient documentation

## 2014-01-10 DIAGNOSIS — I4891 Unspecified atrial fibrillation: Secondary | ICD-10-CM | POA: Insufficient documentation

## 2014-01-10 DIAGNOSIS — I509 Heart failure, unspecified: Secondary | ICD-10-CM

## 2014-01-10 DIAGNOSIS — E785 Hyperlipidemia, unspecified: Secondary | ICD-10-CM | POA: Insufficient documentation

## 2014-01-10 HISTORY — DX: Hypothyroidism, unspecified: E03.9

## 2014-01-10 HISTORY — DX: Heart failure, unspecified: I50.9

## 2014-01-10 HISTORY — DX: Gastro-esophageal reflux disease without esophagitis: K21.9

## 2014-01-10 HISTORY — DX: Unspecified atrial fibrillation: I48.91

## 2014-01-11 DIAGNOSIS — Z7901 Long term (current) use of anticoagulants: Secondary | ICD-10-CM | POA: Diagnosis not present

## 2014-01-11 DIAGNOSIS — I4891 Unspecified atrial fibrillation: Secondary | ICD-10-CM | POA: Diagnosis not present

## 2014-01-25 DIAGNOSIS — I4891 Unspecified atrial fibrillation: Secondary | ICD-10-CM | POA: Diagnosis not present

## 2014-01-25 DIAGNOSIS — Z7901 Long term (current) use of anticoagulants: Secondary | ICD-10-CM | POA: Diagnosis not present

## 2014-02-01 DIAGNOSIS — I4891 Unspecified atrial fibrillation: Secondary | ICD-10-CM | POA: Diagnosis not present

## 2014-02-01 DIAGNOSIS — I509 Heart failure, unspecified: Secondary | ICD-10-CM | POA: Diagnosis not present

## 2014-02-01 DIAGNOSIS — I5022 Chronic systolic (congestive) heart failure: Secondary | ICD-10-CM | POA: Diagnosis not present

## 2014-02-02 DIAGNOSIS — I10 Essential (primary) hypertension: Secondary | ICD-10-CM | POA: Diagnosis not present

## 2014-02-02 DIAGNOSIS — I4891 Unspecified atrial fibrillation: Secondary | ICD-10-CM | POA: Diagnosis not present

## 2014-02-02 DIAGNOSIS — Z79899 Other long term (current) drug therapy: Secondary | ICD-10-CM | POA: Diagnosis not present

## 2014-02-02 DIAGNOSIS — E785 Hyperlipidemia, unspecified: Secondary | ICD-10-CM | POA: Diagnosis not present

## 2014-02-02 DIAGNOSIS — E039 Hypothyroidism, unspecified: Secondary | ICD-10-CM | POA: Diagnosis not present

## 2014-02-02 DIAGNOSIS — Z6826 Body mass index (BMI) 26.0-26.9, adult: Secondary | ICD-10-CM | POA: Diagnosis not present

## 2014-02-08 DIAGNOSIS — Z7901 Long term (current) use of anticoagulants: Secondary | ICD-10-CM | POA: Diagnosis not present

## 2014-02-08 DIAGNOSIS — I4891 Unspecified atrial fibrillation: Secondary | ICD-10-CM | POA: Diagnosis not present

## 2014-02-16 DIAGNOSIS — I4891 Unspecified atrial fibrillation: Secondary | ICD-10-CM | POA: Diagnosis not present

## 2014-02-16 DIAGNOSIS — Z8679 Personal history of other diseases of the circulatory system: Secondary | ICD-10-CM | POA: Diagnosis not present

## 2014-02-16 DIAGNOSIS — Z7901 Long term (current) use of anticoagulants: Secondary | ICD-10-CM | POA: Diagnosis not present

## 2014-02-16 DIAGNOSIS — Z79899 Other long term (current) drug therapy: Secondary | ICD-10-CM | POA: Diagnosis not present

## 2014-02-19 DIAGNOSIS — E785 Hyperlipidemia, unspecified: Secondary | ICD-10-CM | POA: Diagnosis not present

## 2014-02-19 DIAGNOSIS — K219 Gastro-esophageal reflux disease without esophagitis: Secondary | ICD-10-CM | POA: Diagnosis not present

## 2014-02-19 DIAGNOSIS — Z888 Allergy status to other drugs, medicaments and biological substances status: Secondary | ICD-10-CM | POA: Diagnosis not present

## 2014-02-19 DIAGNOSIS — E039 Hypothyroidism, unspecified: Secondary | ICD-10-CM | POA: Diagnosis not present

## 2014-02-19 DIAGNOSIS — I498 Other specified cardiac arrhythmias: Secondary | ICD-10-CM | POA: Diagnosis not present

## 2014-02-19 DIAGNOSIS — Z881 Allergy status to other antibiotic agents status: Secondary | ICD-10-CM | POA: Diagnosis not present

## 2014-02-19 DIAGNOSIS — I4891 Unspecified atrial fibrillation: Secondary | ICD-10-CM | POA: Diagnosis not present

## 2014-02-19 DIAGNOSIS — Z79899 Other long term (current) drug therapy: Secondary | ICD-10-CM | POA: Diagnosis not present

## 2014-02-19 DIAGNOSIS — I10 Essential (primary) hypertension: Secondary | ICD-10-CM | POA: Diagnosis not present

## 2014-02-19 DIAGNOSIS — Z7901 Long term (current) use of anticoagulants: Secondary | ICD-10-CM | POA: Diagnosis not present

## 2014-02-19 DIAGNOSIS — I509 Heart failure, unspecified: Secondary | ICD-10-CM | POA: Diagnosis not present

## 2014-02-20 DIAGNOSIS — I4891 Unspecified atrial fibrillation: Secondary | ICD-10-CM | POA: Diagnosis not present

## 2014-02-20 DIAGNOSIS — E039 Hypothyroidism, unspecified: Secondary | ICD-10-CM | POA: Diagnosis not present

## 2014-02-20 DIAGNOSIS — I509 Heart failure, unspecified: Secondary | ICD-10-CM | POA: Diagnosis not present

## 2014-02-20 DIAGNOSIS — E785 Hyperlipidemia, unspecified: Secondary | ICD-10-CM | POA: Diagnosis not present

## 2014-02-20 DIAGNOSIS — K219 Gastro-esophageal reflux disease without esophagitis: Secondary | ICD-10-CM | POA: Diagnosis not present

## 2014-02-20 DIAGNOSIS — I10 Essential (primary) hypertension: Secondary | ICD-10-CM | POA: Diagnosis not present

## 2014-02-23 DIAGNOSIS — I4891 Unspecified atrial fibrillation: Secondary | ICD-10-CM | POA: Diagnosis not present

## 2014-02-23 DIAGNOSIS — Z7901 Long term (current) use of anticoagulants: Secondary | ICD-10-CM | POA: Diagnosis not present

## 2014-02-27 DIAGNOSIS — I4891 Unspecified atrial fibrillation: Secondary | ICD-10-CM | POA: Diagnosis not present

## 2014-02-27 DIAGNOSIS — Z7901 Long term (current) use of anticoagulants: Secondary | ICD-10-CM | POA: Diagnosis not present

## 2014-03-02 DIAGNOSIS — I4891 Unspecified atrial fibrillation: Secondary | ICD-10-CM | POA: Diagnosis not present

## 2014-03-02 DIAGNOSIS — Z7901 Long term (current) use of anticoagulants: Secondary | ICD-10-CM | POA: Diagnosis not present

## 2014-03-06 DIAGNOSIS — Z7901 Long term (current) use of anticoagulants: Secondary | ICD-10-CM | POA: Diagnosis not present

## 2014-03-06 DIAGNOSIS — I4891 Unspecified atrial fibrillation: Secondary | ICD-10-CM | POA: Diagnosis not present

## 2014-03-09 DIAGNOSIS — Z7901 Long term (current) use of anticoagulants: Secondary | ICD-10-CM | POA: Diagnosis not present

## 2014-03-09 DIAGNOSIS — I4891 Unspecified atrial fibrillation: Secondary | ICD-10-CM | POA: Diagnosis not present

## 2014-03-14 DIAGNOSIS — I10 Essential (primary) hypertension: Secondary | ICD-10-CM | POA: Diagnosis not present

## 2014-03-14 DIAGNOSIS — Z6826 Body mass index (BMI) 26.0-26.9, adult: Secondary | ICD-10-CM | POA: Diagnosis not present

## 2014-03-14 DIAGNOSIS — R7309 Other abnormal glucose: Secondary | ICD-10-CM | POA: Diagnosis not present

## 2014-03-14 DIAGNOSIS — I4891 Unspecified atrial fibrillation: Secondary | ICD-10-CM | POA: Diagnosis not present

## 2014-03-14 DIAGNOSIS — E039 Hypothyroidism, unspecified: Secondary | ICD-10-CM | POA: Diagnosis not present

## 2014-03-15 DIAGNOSIS — N35919 Unspecified urethral stricture, male, unspecified site: Secondary | ICD-10-CM | POA: Diagnosis not present

## 2014-03-15 DIAGNOSIS — R82998 Other abnormal findings in urine: Secondary | ICD-10-CM | POA: Diagnosis not present

## 2014-03-15 DIAGNOSIS — R350 Frequency of micturition: Secondary | ICD-10-CM | POA: Diagnosis not present

## 2014-03-23 DIAGNOSIS — Z7901 Long term (current) use of anticoagulants: Secondary | ICD-10-CM | POA: Diagnosis not present

## 2014-03-23 DIAGNOSIS — I4891 Unspecified atrial fibrillation: Secondary | ICD-10-CM | POA: Diagnosis not present

## 2014-03-28 DIAGNOSIS — D692 Other nonthrombocytopenic purpura: Secondary | ICD-10-CM | POA: Diagnosis not present

## 2014-03-28 DIAGNOSIS — R609 Edema, unspecified: Secondary | ICD-10-CM | POA: Diagnosis not present

## 2014-03-29 DIAGNOSIS — N35919 Unspecified urethral stricture, male, unspecified site: Secondary | ICD-10-CM | POA: Diagnosis not present

## 2014-03-29 DIAGNOSIS — R339 Retention of urine, unspecified: Secondary | ICD-10-CM | POA: Diagnosis not present

## 2014-03-29 DIAGNOSIS — R350 Frequency of micturition: Secondary | ICD-10-CM | POA: Diagnosis not present

## 2014-03-30 DIAGNOSIS — Z7901 Long term (current) use of anticoagulants: Secondary | ICD-10-CM | POA: Diagnosis not present

## 2014-03-30 DIAGNOSIS — I4891 Unspecified atrial fibrillation: Secondary | ICD-10-CM | POA: Diagnosis not present

## 2014-04-01 DIAGNOSIS — I10 Essential (primary) hypertension: Secondary | ICD-10-CM | POA: Diagnosis not present

## 2014-04-01 DIAGNOSIS — N8110 Cystocele, unspecified: Secondary | ICD-10-CM | POA: Diagnosis not present

## 2014-04-01 DIAGNOSIS — E039 Hypothyroidism, unspecified: Secondary | ICD-10-CM | POA: Diagnosis not present

## 2014-04-01 DIAGNOSIS — Z79899 Other long term (current) drug therapy: Secondary | ICD-10-CM | POA: Diagnosis not present

## 2014-04-01 DIAGNOSIS — N390 Urinary tract infection, site not specified: Secondary | ICD-10-CM | POA: Diagnosis not present

## 2014-04-01 DIAGNOSIS — R300 Dysuria: Secondary | ICD-10-CM | POA: Diagnosis not present

## 2014-04-01 DIAGNOSIS — I4891 Unspecified atrial fibrillation: Secondary | ICD-10-CM | POA: Diagnosis not present

## 2014-04-01 DIAGNOSIS — Z7901 Long term (current) use of anticoagulants: Secondary | ICD-10-CM | POA: Diagnosis not present

## 2014-04-04 DIAGNOSIS — N816 Rectocele: Secondary | ICD-10-CM | POA: Diagnosis not present

## 2014-04-05 DIAGNOSIS — I5022 Chronic systolic (congestive) heart failure: Secondary | ICD-10-CM | POA: Diagnosis not present

## 2014-04-05 DIAGNOSIS — I4891 Unspecified atrial fibrillation: Secondary | ICD-10-CM | POA: Diagnosis not present

## 2014-04-05 DIAGNOSIS — I509 Heart failure, unspecified: Secondary | ICD-10-CM | POA: Diagnosis not present

## 2014-04-10 DIAGNOSIS — N35919 Unspecified urethral stricture, male, unspecified site: Secondary | ICD-10-CM | POA: Diagnosis not present

## 2014-04-10 DIAGNOSIS — R339 Retention of urine, unspecified: Secondary | ICD-10-CM | POA: Diagnosis not present

## 2014-04-11 DIAGNOSIS — Z6825 Body mass index (BMI) 25.0-25.9, adult: Secondary | ICD-10-CM | POA: Diagnosis not present

## 2014-04-11 DIAGNOSIS — I10 Essential (primary) hypertension: Secondary | ICD-10-CM | POA: Diagnosis not present

## 2014-04-13 DIAGNOSIS — I4891 Unspecified atrial fibrillation: Secondary | ICD-10-CM | POA: Diagnosis not present

## 2014-04-13 DIAGNOSIS — Z7901 Long term (current) use of anticoagulants: Secondary | ICD-10-CM | POA: Diagnosis not present

## 2014-04-17 DIAGNOSIS — I10 Essential (primary) hypertension: Secondary | ICD-10-CM | POA: Diagnosis not present

## 2014-04-18 DIAGNOSIS — T887XXA Unspecified adverse effect of drug or medicament, initial encounter: Secondary | ICD-10-CM | POA: Diagnosis not present

## 2014-04-18 DIAGNOSIS — Z6825 Body mass index (BMI) 25.0-25.9, adult: Secondary | ICD-10-CM | POA: Diagnosis not present

## 2014-04-18 DIAGNOSIS — I10 Essential (primary) hypertension: Secondary | ICD-10-CM | POA: Diagnosis not present

## 2014-04-18 DIAGNOSIS — R110 Nausea: Secondary | ICD-10-CM | POA: Diagnosis not present

## 2014-04-21 DIAGNOSIS — R5381 Other malaise: Secondary | ICD-10-CM | POA: Diagnosis not present

## 2014-04-21 DIAGNOSIS — R110 Nausea: Secondary | ICD-10-CM | POA: Diagnosis not present

## 2014-04-21 DIAGNOSIS — F411 Generalized anxiety disorder: Secondary | ICD-10-CM | POA: Diagnosis not present

## 2014-04-21 DIAGNOSIS — I4891 Unspecified atrial fibrillation: Secondary | ICD-10-CM | POA: Diagnosis not present

## 2014-04-21 DIAGNOSIS — Z79899 Other long term (current) drug therapy: Secondary | ICD-10-CM | POA: Diagnosis not present

## 2014-04-21 DIAGNOSIS — R0602 Shortness of breath: Secondary | ICD-10-CM | POA: Diagnosis not present

## 2014-04-21 DIAGNOSIS — E7800 Pure hypercholesterolemia, unspecified: Secondary | ICD-10-CM | POA: Diagnosis not present

## 2014-04-21 DIAGNOSIS — Z7901 Long term (current) use of anticoagulants: Secondary | ICD-10-CM | POA: Diagnosis not present

## 2014-04-21 DIAGNOSIS — R51 Headache: Secondary | ICD-10-CM | POA: Diagnosis not present

## 2014-04-21 DIAGNOSIS — E0590 Thyrotoxicosis, unspecified without thyrotoxic crisis or storm: Secondary | ICD-10-CM | POA: Diagnosis not present

## 2014-04-21 DIAGNOSIS — I10 Essential (primary) hypertension: Secondary | ICD-10-CM | POA: Diagnosis not present

## 2014-04-21 DIAGNOSIS — K219 Gastro-esophageal reflux disease without esophagitis: Secondary | ICD-10-CM | POA: Diagnosis not present

## 2014-04-26 DIAGNOSIS — F411 Generalized anxiety disorder: Secondary | ICD-10-CM | POA: Diagnosis not present

## 2014-04-26 DIAGNOSIS — I10 Essential (primary) hypertension: Secondary | ICD-10-CM | POA: Diagnosis not present

## 2014-04-26 DIAGNOSIS — K219 Gastro-esophageal reflux disease without esophagitis: Secondary | ICD-10-CM | POA: Diagnosis not present

## 2014-04-27 DIAGNOSIS — Z7901 Long term (current) use of anticoagulants: Secondary | ICD-10-CM | POA: Diagnosis not present

## 2014-04-27 DIAGNOSIS — I4891 Unspecified atrial fibrillation: Secondary | ICD-10-CM | POA: Diagnosis not present

## 2014-04-27 DIAGNOSIS — F411 Generalized anxiety disorder: Secondary | ICD-10-CM | POA: Diagnosis not present

## 2014-04-27 DIAGNOSIS — I509 Heart failure, unspecified: Secondary | ICD-10-CM | POA: Diagnosis not present

## 2014-04-27 DIAGNOSIS — N189 Chronic kidney disease, unspecified: Secondary | ICD-10-CM | POA: Diagnosis not present

## 2014-04-27 DIAGNOSIS — Z79899 Other long term (current) drug therapy: Secondary | ICD-10-CM | POA: Diagnosis not present

## 2014-04-27 DIAGNOSIS — I130 Hypertensive heart and chronic kidney disease with heart failure and stage 1 through stage 4 chronic kidney disease, or unspecified chronic kidney disease: Secondary | ICD-10-CM | POA: Diagnosis not present

## 2014-05-07 DIAGNOSIS — I4891 Unspecified atrial fibrillation: Secondary | ICD-10-CM | POA: Diagnosis not present

## 2014-05-09 DIAGNOSIS — N898 Other specified noninflammatory disorders of vagina: Secondary | ICD-10-CM | POA: Diagnosis not present

## 2014-05-09 DIAGNOSIS — R300 Dysuria: Secondary | ICD-10-CM | POA: Diagnosis not present

## 2014-05-10 DIAGNOSIS — I10 Essential (primary) hypertension: Secondary | ICD-10-CM | POA: Diagnosis not present

## 2014-05-10 DIAGNOSIS — I4891 Unspecified atrial fibrillation: Secondary | ICD-10-CM | POA: Diagnosis not present

## 2014-05-14 DIAGNOSIS — I480 Paroxysmal atrial fibrillation: Secondary | ICD-10-CM | POA: Diagnosis not present

## 2014-05-14 DIAGNOSIS — I4891 Unspecified atrial fibrillation: Secondary | ICD-10-CM | POA: Diagnosis not present

## 2014-05-18 DIAGNOSIS — R791 Abnormal coagulation profile: Secondary | ICD-10-CM | POA: Diagnosis not present

## 2014-05-18 DIAGNOSIS — F419 Anxiety disorder, unspecified: Secondary | ICD-10-CM | POA: Diagnosis not present

## 2014-05-18 DIAGNOSIS — I4891 Unspecified atrial fibrillation: Secondary | ICD-10-CM | POA: Diagnosis not present

## 2014-05-18 DIAGNOSIS — Z6826 Body mass index (BMI) 26.0-26.9, adult: Secondary | ICD-10-CM | POA: Diagnosis not present

## 2014-05-18 DIAGNOSIS — G629 Polyneuropathy, unspecified: Secondary | ICD-10-CM | POA: Diagnosis not present

## 2014-05-18 DIAGNOSIS — Z7901 Long term (current) use of anticoagulants: Secondary | ICD-10-CM | POA: Diagnosis not present

## 2014-05-21 DIAGNOSIS — Z6826 Body mass index (BMI) 26.0-26.9, adult: Secondary | ICD-10-CM | POA: Diagnosis not present

## 2014-05-21 DIAGNOSIS — I10 Essential (primary) hypertension: Secondary | ICD-10-CM | POA: Diagnosis not present

## 2014-05-21 DIAGNOSIS — Z79899 Other long term (current) drug therapy: Secondary | ICD-10-CM | POA: Diagnosis not present

## 2014-05-21 DIAGNOSIS — I4891 Unspecified atrial fibrillation: Secondary | ICD-10-CM | POA: Diagnosis not present

## 2014-05-21 DIAGNOSIS — E785 Hyperlipidemia, unspecified: Secondary | ICD-10-CM | POA: Diagnosis not present

## 2014-05-21 DIAGNOSIS — E039 Hypothyroidism, unspecified: Secondary | ICD-10-CM | POA: Diagnosis not present

## 2014-05-22 DIAGNOSIS — E039 Hypothyroidism, unspecified: Secondary | ICD-10-CM | POA: Diagnosis not present

## 2014-05-22 DIAGNOSIS — E785 Hyperlipidemia, unspecified: Secondary | ICD-10-CM | POA: Diagnosis not present

## 2014-05-22 DIAGNOSIS — I10 Essential (primary) hypertension: Secondary | ICD-10-CM | POA: Diagnosis not present

## 2014-05-22 DIAGNOSIS — I4891 Unspecified atrial fibrillation: Secondary | ICD-10-CM | POA: Diagnosis not present

## 2014-05-22 DIAGNOSIS — Z6826 Body mass index (BMI) 26.0-26.9, adult: Secondary | ICD-10-CM | POA: Diagnosis not present

## 2014-05-25 DIAGNOSIS — Z7901 Long term (current) use of anticoagulants: Secondary | ICD-10-CM | POA: Diagnosis not present

## 2014-05-25 DIAGNOSIS — I480 Paroxysmal atrial fibrillation: Secondary | ICD-10-CM | POA: Diagnosis not present

## 2014-05-25 DIAGNOSIS — I471 Supraventricular tachycardia: Secondary | ICD-10-CM | POA: Diagnosis not present

## 2014-05-29 DIAGNOSIS — M79672 Pain in left foot: Secondary | ICD-10-CM | POA: Diagnosis not present

## 2014-05-29 DIAGNOSIS — M79671 Pain in right foot: Secondary | ICD-10-CM | POA: Diagnosis not present

## 2014-05-30 DIAGNOSIS — I4891 Unspecified atrial fibrillation: Secondary | ICD-10-CM | POA: Diagnosis not present

## 2014-06-06 DIAGNOSIS — Z5181 Encounter for therapeutic drug level monitoring: Secondary | ICD-10-CM | POA: Insufficient documentation

## 2014-06-06 DIAGNOSIS — Z79899 Other long term (current) drug therapy: Secondary | ICD-10-CM

## 2014-06-06 DIAGNOSIS — I4892 Unspecified atrial flutter: Secondary | ICD-10-CM

## 2014-06-06 HISTORY — DX: Unspecified atrial flutter: I48.92

## 2014-06-06 HISTORY — DX: Encounter for therapeutic drug level monitoring: Z51.81

## 2014-06-06 HISTORY — DX: Other long term (current) drug therapy: Z79.899

## 2014-06-07 DIAGNOSIS — I4891 Unspecified atrial fibrillation: Secondary | ICD-10-CM | POA: Diagnosis not present

## 2014-06-07 DIAGNOSIS — Z5181 Encounter for therapeutic drug level monitoring: Secondary | ICD-10-CM | POA: Diagnosis not present

## 2014-06-07 DIAGNOSIS — I4892 Unspecified atrial flutter: Secondary | ICD-10-CM | POA: Diagnosis not present

## 2014-06-07 DIAGNOSIS — Z79899 Other long term (current) drug therapy: Secondary | ICD-10-CM | POA: Diagnosis not present

## 2014-06-08 DIAGNOSIS — I4891 Unspecified atrial fibrillation: Secondary | ICD-10-CM | POA: Diagnosis not present

## 2014-06-08 DIAGNOSIS — Z7901 Long term (current) use of anticoagulants: Secondary | ICD-10-CM | POA: Diagnosis not present

## 2014-06-15 DIAGNOSIS — I4892 Unspecified atrial flutter: Secondary | ICD-10-CM | POA: Diagnosis present

## 2014-06-15 DIAGNOSIS — I4581 Long QT syndrome: Secondary | ICD-10-CM | POA: Diagnosis present

## 2014-06-15 DIAGNOSIS — I10 Essential (primary) hypertension: Secondary | ICD-10-CM | POA: Diagnosis not present

## 2014-06-15 DIAGNOSIS — Z7901 Long term (current) use of anticoagulants: Secondary | ICD-10-CM | POA: Diagnosis not present

## 2014-06-15 DIAGNOSIS — I481 Persistent atrial fibrillation: Secondary | ICD-10-CM | POA: Diagnosis present

## 2014-06-15 DIAGNOSIS — I4891 Unspecified atrial fibrillation: Secondary | ICD-10-CM | POA: Diagnosis not present

## 2014-06-15 DIAGNOSIS — I471 Supraventricular tachycardia: Secondary | ICD-10-CM | POA: Diagnosis present

## 2014-06-15 DIAGNOSIS — I480 Paroxysmal atrial fibrillation: Secondary | ICD-10-CM | POA: Diagnosis not present

## 2014-06-15 DIAGNOSIS — E039 Hypothyroidism, unspecified: Secondary | ICD-10-CM | POA: Diagnosis present

## 2014-06-15 DIAGNOSIS — K219 Gastro-esophageal reflux disease without esophagitis: Secondary | ICD-10-CM | POA: Diagnosis present

## 2014-06-15 DIAGNOSIS — Z87891 Personal history of nicotine dependence: Secondary | ICD-10-CM | POA: Diagnosis not present

## 2014-06-15 DIAGNOSIS — Z823 Family history of stroke: Secondary | ICD-10-CM | POA: Diagnosis not present

## 2014-06-15 DIAGNOSIS — E785 Hyperlipidemia, unspecified: Secondary | ICD-10-CM | POA: Diagnosis present

## 2014-06-15 DIAGNOSIS — Z8249 Family history of ischemic heart disease and other diseases of the circulatory system: Secondary | ICD-10-CM | POA: Diagnosis not present

## 2014-06-22 DIAGNOSIS — Z7901 Long term (current) use of anticoagulants: Secondary | ICD-10-CM | POA: Diagnosis not present

## 2014-07-20 DIAGNOSIS — Z7901 Long term (current) use of anticoagulants: Secondary | ICD-10-CM | POA: Diagnosis not present

## 2014-08-17 DIAGNOSIS — Z7901 Long term (current) use of anticoagulants: Secondary | ICD-10-CM | POA: Diagnosis not present

## 2014-08-29 DIAGNOSIS — Z1389 Encounter for screening for other disorder: Secondary | ICD-10-CM | POA: Diagnosis not present

## 2014-08-29 DIAGNOSIS — I10 Essential (primary) hypertension: Secondary | ICD-10-CM | POA: Diagnosis not present

## 2014-08-29 DIAGNOSIS — K219 Gastro-esophageal reflux disease without esophagitis: Secondary | ICD-10-CM | POA: Diagnosis not present

## 2014-08-29 DIAGNOSIS — Z6825 Body mass index (BMI) 25.0-25.9, adult: Secondary | ICD-10-CM | POA: Diagnosis not present

## 2014-08-29 DIAGNOSIS — I4891 Unspecified atrial fibrillation: Secondary | ICD-10-CM | POA: Diagnosis not present

## 2014-08-29 DIAGNOSIS — Z79899 Other long term (current) drug therapy: Secondary | ICD-10-CM | POA: Diagnosis not present

## 2014-08-29 DIAGNOSIS — Z9181 History of falling: Secondary | ICD-10-CM | POA: Diagnosis not present

## 2014-08-29 DIAGNOSIS — Z1231 Encounter for screening mammogram for malignant neoplasm of breast: Secondary | ICD-10-CM | POA: Diagnosis not present

## 2014-08-29 DIAGNOSIS — E039 Hypothyroidism, unspecified: Secondary | ICD-10-CM | POA: Diagnosis not present

## 2014-08-29 DIAGNOSIS — M8580 Other specified disorders of bone density and structure, unspecified site: Secondary | ICD-10-CM | POA: Diagnosis not present

## 2014-08-29 DIAGNOSIS — E785 Hyperlipidemia, unspecified: Secondary | ICD-10-CM | POA: Diagnosis not present

## 2014-09-03 DIAGNOSIS — Z7901 Long term (current) use of anticoagulants: Secondary | ICD-10-CM | POA: Diagnosis not present

## 2014-09-03 DIAGNOSIS — Z79899 Other long term (current) drug therapy: Secondary | ICD-10-CM | POA: Diagnosis not present

## 2014-09-03 DIAGNOSIS — I480 Paroxysmal atrial fibrillation: Secondary | ICD-10-CM | POA: Diagnosis not present

## 2014-09-03 DIAGNOSIS — I471 Supraventricular tachycardia: Secondary | ICD-10-CM | POA: Diagnosis not present

## 2014-09-18 DIAGNOSIS — Z7901 Long term (current) use of anticoagulants: Secondary | ICD-10-CM | POA: Diagnosis not present

## 2014-09-18 DIAGNOSIS — I4891 Unspecified atrial fibrillation: Secondary | ICD-10-CM | POA: Diagnosis not present

## 2014-09-19 DIAGNOSIS — I4891 Unspecified atrial fibrillation: Secondary | ICD-10-CM | POA: Diagnosis not present

## 2014-09-20 DIAGNOSIS — I4891 Unspecified atrial fibrillation: Secondary | ICD-10-CM | POA: Diagnosis not present

## 2014-10-05 DIAGNOSIS — R142 Eructation: Secondary | ICD-10-CM | POA: Diagnosis not present

## 2014-10-05 DIAGNOSIS — K219 Gastro-esophageal reflux disease without esophagitis: Secondary | ICD-10-CM | POA: Diagnosis not present

## 2014-10-05 DIAGNOSIS — K5900 Constipation, unspecified: Secondary | ICD-10-CM | POA: Diagnosis not present

## 2014-10-09 DIAGNOSIS — I4891 Unspecified atrial fibrillation: Secondary | ICD-10-CM | POA: Diagnosis not present

## 2014-10-09 DIAGNOSIS — Z7901 Long term (current) use of anticoagulants: Secondary | ICD-10-CM | POA: Diagnosis not present

## 2014-10-23 DIAGNOSIS — Z7901 Long term (current) use of anticoagulants: Secondary | ICD-10-CM | POA: Diagnosis not present

## 2014-10-23 DIAGNOSIS — I4891 Unspecified atrial fibrillation: Secondary | ICD-10-CM | POA: Diagnosis not present

## 2014-10-29 DIAGNOSIS — F419 Anxiety disorder, unspecified: Secondary | ICD-10-CM | POA: Diagnosis not present

## 2014-10-29 DIAGNOSIS — M8589 Other specified disorders of bone density and structure, multiple sites: Secondary | ICD-10-CM | POA: Diagnosis not present

## 2014-10-29 DIAGNOSIS — Z1231 Encounter for screening mammogram for malignant neoplasm of breast: Secondary | ICD-10-CM | POA: Diagnosis not present

## 2014-10-29 DIAGNOSIS — L270 Generalized skin eruption due to drugs and medicaments taken internally: Secondary | ICD-10-CM | POA: Diagnosis not present

## 2014-10-29 DIAGNOSIS — I10 Essential (primary) hypertension: Secondary | ICD-10-CM | POA: Diagnosis not present

## 2014-10-29 DIAGNOSIS — Z13820 Encounter for screening for osteoporosis: Secondary | ICD-10-CM | POA: Diagnosis not present

## 2014-10-29 DIAGNOSIS — E785 Hyperlipidemia, unspecified: Secondary | ICD-10-CM | POA: Diagnosis not present

## 2014-10-29 DIAGNOSIS — Z6826 Body mass index (BMI) 26.0-26.9, adult: Secondary | ICD-10-CM | POA: Diagnosis not present

## 2014-11-13 DIAGNOSIS — Z7901 Long term (current) use of anticoagulants: Secondary | ICD-10-CM | POA: Diagnosis not present

## 2014-11-13 DIAGNOSIS — I480 Paroxysmal atrial fibrillation: Secondary | ICD-10-CM | POA: Diagnosis not present

## 2014-12-04 DIAGNOSIS — I10 Essential (primary) hypertension: Secondary | ICD-10-CM | POA: Diagnosis not present

## 2014-12-04 DIAGNOSIS — I4891 Unspecified atrial fibrillation: Secondary | ICD-10-CM | POA: Diagnosis not present

## 2014-12-04 DIAGNOSIS — Z79899 Other long term (current) drug therapy: Secondary | ICD-10-CM | POA: Diagnosis not present

## 2014-12-04 DIAGNOSIS — E559 Vitamin D deficiency, unspecified: Secondary | ICD-10-CM | POA: Diagnosis not present

## 2014-12-04 DIAGNOSIS — F419 Anxiety disorder, unspecified: Secondary | ICD-10-CM | POA: Diagnosis not present

## 2014-12-04 DIAGNOSIS — E039 Hypothyroidism, unspecified: Secondary | ICD-10-CM | POA: Diagnosis not present

## 2014-12-04 DIAGNOSIS — Z6825 Body mass index (BMI) 25.0-25.9, adult: Secondary | ICD-10-CM | POA: Diagnosis not present

## 2014-12-04 DIAGNOSIS — I6529 Occlusion and stenosis of unspecified carotid artery: Secondary | ICD-10-CM | POA: Diagnosis not present

## 2014-12-04 DIAGNOSIS — E785 Hyperlipidemia, unspecified: Secondary | ICD-10-CM | POA: Diagnosis not present

## 2014-12-12 DIAGNOSIS — L259 Unspecified contact dermatitis, unspecified cause: Secondary | ICD-10-CM | POA: Diagnosis not present

## 2014-12-12 DIAGNOSIS — J309 Allergic rhinitis, unspecified: Secondary | ICD-10-CM | POA: Diagnosis not present

## 2014-12-12 DIAGNOSIS — Z7901 Long term (current) use of anticoagulants: Secondary | ICD-10-CM | POA: Diagnosis not present

## 2014-12-12 DIAGNOSIS — I480 Paroxysmal atrial fibrillation: Secondary | ICD-10-CM | POA: Diagnosis not present

## 2014-12-19 DIAGNOSIS — I872 Venous insufficiency (chronic) (peripheral): Secondary | ICD-10-CM | POA: Diagnosis not present

## 2014-12-19 DIAGNOSIS — L958 Other vasculitis limited to the skin: Secondary | ICD-10-CM | POA: Diagnosis not present

## 2014-12-19 DIAGNOSIS — I8311 Varicose veins of right lower extremity with inflammation: Secondary | ICD-10-CM | POA: Diagnosis not present

## 2014-12-19 DIAGNOSIS — Z6825 Body mass index (BMI) 25.0-25.9, adult: Secondary | ICD-10-CM | POA: Diagnosis not present

## 2015-01-15 DIAGNOSIS — I4891 Unspecified atrial fibrillation: Secondary | ICD-10-CM | POA: Diagnosis not present

## 2015-01-15 DIAGNOSIS — Z7901 Long term (current) use of anticoagulants: Secondary | ICD-10-CM | POA: Diagnosis not present

## 2015-01-16 DIAGNOSIS — R197 Diarrhea, unspecified: Secondary | ICD-10-CM | POA: Diagnosis not present

## 2015-01-16 DIAGNOSIS — Z6826 Body mass index (BMI) 26.0-26.9, adult: Secondary | ICD-10-CM | POA: Diagnosis not present

## 2015-01-16 DIAGNOSIS — R110 Nausea: Secondary | ICD-10-CM | POA: Diagnosis not present

## 2015-01-29 DIAGNOSIS — R202 Paresthesia of skin: Secondary | ICD-10-CM | POA: Diagnosis not present

## 2015-01-29 DIAGNOSIS — L819 Disorder of pigmentation, unspecified: Secondary | ICD-10-CM | POA: Diagnosis not present

## 2015-01-29 DIAGNOSIS — I872 Venous insufficiency (chronic) (peripheral): Secondary | ICD-10-CM | POA: Diagnosis not present

## 2015-01-29 DIAGNOSIS — R21 Rash and other nonspecific skin eruption: Secondary | ICD-10-CM | POA: Diagnosis not present

## 2015-02-08 DIAGNOSIS — J019 Acute sinusitis, unspecified: Secondary | ICD-10-CM | POA: Diagnosis not present

## 2015-02-08 DIAGNOSIS — Z6826 Body mass index (BMI) 26.0-26.9, adult: Secondary | ICD-10-CM | POA: Diagnosis not present

## 2015-02-10 DIAGNOSIS — K5792 Diverticulitis of intestine, part unspecified, without perforation or abscess without bleeding: Secondary | ICD-10-CM | POA: Diagnosis not present

## 2015-02-10 DIAGNOSIS — N8320 Unspecified ovarian cysts: Secondary | ICD-10-CM | POA: Diagnosis not present

## 2015-02-10 DIAGNOSIS — J449 Chronic obstructive pulmonary disease, unspecified: Secondary | ICD-10-CM | POA: Diagnosis not present

## 2015-02-10 DIAGNOSIS — Z90710 Acquired absence of both cervix and uterus: Secondary | ICD-10-CM | POA: Diagnosis not present

## 2015-02-10 DIAGNOSIS — K6389 Other specified diseases of intestine: Secondary | ICD-10-CM | POA: Diagnosis not present

## 2015-02-10 DIAGNOSIS — K5730 Diverticulosis of large intestine without perforation or abscess without bleeding: Secondary | ICD-10-CM | POA: Diagnosis not present

## 2015-02-13 DIAGNOSIS — I480 Paroxysmal atrial fibrillation: Secondary | ICD-10-CM

## 2015-02-13 DIAGNOSIS — Z79899 Other long term (current) drug therapy: Secondary | ICD-10-CM | POA: Insufficient documentation

## 2015-02-13 DIAGNOSIS — Z7901 Long term (current) use of anticoagulants: Secondary | ICD-10-CM

## 2015-02-13 DIAGNOSIS — I471 Supraventricular tachycardia: Secondary | ICD-10-CM | POA: Insufficient documentation

## 2015-02-13 HISTORY — DX: Paroxysmal atrial fibrillation: I48.0

## 2015-02-13 HISTORY — DX: Long term (current) use of anticoagulants: Z79.01

## 2015-02-13 HISTORY — DX: Other long term (current) drug therapy: Z79.899

## 2015-02-14 DIAGNOSIS — Z7901 Long term (current) use of anticoagulants: Secondary | ICD-10-CM | POA: Diagnosis not present

## 2015-02-14 DIAGNOSIS — I471 Supraventricular tachycardia: Secondary | ICD-10-CM | POA: Diagnosis not present

## 2015-02-14 DIAGNOSIS — I480 Paroxysmal atrial fibrillation: Secondary | ICD-10-CM | POA: Diagnosis not present

## 2015-02-14 DIAGNOSIS — Z79899 Other long term (current) drug therapy: Secondary | ICD-10-CM | POA: Diagnosis not present

## 2015-02-18 DIAGNOSIS — N8320 Unspecified ovarian cysts: Secondary | ICD-10-CM | POA: Diagnosis not present

## 2015-02-18 DIAGNOSIS — N816 Rectocele: Secondary | ICD-10-CM | POA: Diagnosis not present

## 2015-02-18 DIAGNOSIS — N359 Urethral stricture, unspecified: Secondary | ICD-10-CM | POA: Diagnosis not present

## 2015-02-18 DIAGNOSIS — K5792 Diverticulitis of intestine, part unspecified, without perforation or abscess without bleeding: Secondary | ICD-10-CM | POA: Diagnosis not present

## 2015-02-19 DIAGNOSIS — Z6826 Body mass index (BMI) 26.0-26.9, adult: Secondary | ICD-10-CM | POA: Diagnosis not present

## 2015-02-19 DIAGNOSIS — K5792 Diverticulitis of intestine, part unspecified, without perforation or abscess without bleeding: Secondary | ICD-10-CM | POA: Diagnosis not present

## 2015-02-19 DIAGNOSIS — N8320 Unspecified ovarian cysts: Secondary | ICD-10-CM | POA: Diagnosis not present

## 2015-02-21 DIAGNOSIS — Z7901 Long term (current) use of anticoagulants: Secondary | ICD-10-CM | POA: Diagnosis not present

## 2015-02-21 DIAGNOSIS — I482 Chronic atrial fibrillation: Secondary | ICD-10-CM | POA: Diagnosis not present

## 2015-02-22 DIAGNOSIS — H3561 Retinal hemorrhage, right eye: Secondary | ICD-10-CM | POA: Diagnosis not present

## 2015-03-07 DIAGNOSIS — Z79899 Other long term (current) drug therapy: Secondary | ICD-10-CM | POA: Diagnosis not present

## 2015-03-07 DIAGNOSIS — E785 Hyperlipidemia, unspecified: Secondary | ICD-10-CM | POA: Diagnosis not present

## 2015-03-07 DIAGNOSIS — E039 Hypothyroidism, unspecified: Secondary | ICD-10-CM | POA: Diagnosis not present

## 2015-03-07 DIAGNOSIS — I10 Essential (primary) hypertension: Secondary | ICD-10-CM | POA: Diagnosis not present

## 2015-03-07 DIAGNOSIS — I482 Chronic atrial fibrillation: Secondary | ICD-10-CM | POA: Diagnosis not present

## 2015-03-07 DIAGNOSIS — I4891 Unspecified atrial fibrillation: Secondary | ICD-10-CM | POA: Diagnosis not present

## 2015-03-07 DIAGNOSIS — Z6826 Body mass index (BMI) 26.0-26.9, adult: Secondary | ICD-10-CM | POA: Diagnosis not present

## 2015-03-07 DIAGNOSIS — E038 Other specified hypothyroidism: Secondary | ICD-10-CM | POA: Diagnosis not present

## 2015-03-07 DIAGNOSIS — E780 Pure hypercholesterolemia: Secondary | ICD-10-CM | POA: Diagnosis not present

## 2015-03-07 DIAGNOSIS — Z7901 Long term (current) use of anticoagulants: Secondary | ICD-10-CM | POA: Diagnosis not present

## 2015-03-18 DIAGNOSIS — R110 Nausea: Secondary | ICD-10-CM | POA: Diagnosis not present

## 2015-03-18 DIAGNOSIS — Z6826 Body mass index (BMI) 26.0-26.9, adult: Secondary | ICD-10-CM | POA: Diagnosis not present

## 2015-03-18 DIAGNOSIS — I10 Essential (primary) hypertension: Secondary | ICD-10-CM | POA: Diagnosis not present

## 2015-03-18 DIAGNOSIS — K219 Gastro-esophageal reflux disease without esophagitis: Secondary | ICD-10-CM | POA: Diagnosis not present

## 2015-03-21 DIAGNOSIS — K210 Gastro-esophageal reflux disease with esophagitis: Secondary | ICD-10-CM | POA: Diagnosis not present

## 2015-03-21 DIAGNOSIS — I482 Chronic atrial fibrillation: Secondary | ICD-10-CM | POA: Diagnosis not present

## 2015-03-21 DIAGNOSIS — Z79899 Other long term (current) drug therapy: Secondary | ICD-10-CM | POA: Diagnosis not present

## 2015-03-21 DIAGNOSIS — K219 Gastro-esophageal reflux disease without esophagitis: Secondary | ICD-10-CM | POA: Diagnosis not present

## 2015-04-01 DIAGNOSIS — R300 Dysuria: Secondary | ICD-10-CM | POA: Diagnosis not present

## 2015-04-01 DIAGNOSIS — N8329 Other ovarian cysts: Secondary | ICD-10-CM | POA: Diagnosis not present

## 2015-04-05 DIAGNOSIS — I4891 Unspecified atrial fibrillation: Secondary | ICD-10-CM | POA: Diagnosis not present

## 2015-04-05 DIAGNOSIS — N8320 Unspecified ovarian cysts: Secondary | ICD-10-CM | POA: Diagnosis not present

## 2015-04-05 DIAGNOSIS — R1032 Left lower quadrant pain: Secondary | ICD-10-CM | POA: Diagnosis not present

## 2015-04-05 DIAGNOSIS — R1031 Right lower quadrant pain: Secondary | ICD-10-CM | POA: Diagnosis not present

## 2015-04-05 DIAGNOSIS — R1013 Epigastric pain: Secondary | ICD-10-CM | POA: Diagnosis not present

## 2015-04-05 DIAGNOSIS — R531 Weakness: Secondary | ICD-10-CM | POA: Diagnosis not present

## 2015-04-05 DIAGNOSIS — I509 Heart failure, unspecified: Secondary | ICD-10-CM | POA: Diagnosis not present

## 2015-04-05 DIAGNOSIS — R109 Unspecified abdominal pain: Secondary | ICD-10-CM | POA: Diagnosis not present

## 2015-04-05 DIAGNOSIS — Z7901 Long term (current) use of anticoagulants: Secondary | ICD-10-CM | POA: Diagnosis not present

## 2015-04-05 DIAGNOSIS — R079 Chest pain, unspecified: Secondary | ICD-10-CM | POA: Diagnosis not present

## 2015-04-08 DIAGNOSIS — Z7901 Long term (current) use of anticoagulants: Secondary | ICD-10-CM | POA: Diagnosis not present

## 2015-04-09 DIAGNOSIS — K228 Other specified diseases of esophagus: Secondary | ICD-10-CM | POA: Diagnosis not present

## 2015-04-09 DIAGNOSIS — I4891 Unspecified atrial fibrillation: Secondary | ICD-10-CM | POA: Diagnosis not present

## 2015-04-09 DIAGNOSIS — E039 Hypothyroidism, unspecified: Secondary | ICD-10-CM | POA: Diagnosis not present

## 2015-04-09 DIAGNOSIS — K2950 Unspecified chronic gastritis without bleeding: Secondary | ICD-10-CM | POA: Diagnosis not present

## 2015-04-09 DIAGNOSIS — K317 Polyp of stomach and duodenum: Secondary | ICD-10-CM | POA: Diagnosis not present

## 2015-04-09 DIAGNOSIS — D131 Benign neoplasm of stomach: Secondary | ICD-10-CM | POA: Diagnosis not present

## 2015-04-09 DIAGNOSIS — K219 Gastro-esophageal reflux disease without esophagitis: Secondary | ICD-10-CM | POA: Diagnosis not present

## 2015-04-09 DIAGNOSIS — K449 Diaphragmatic hernia without obstruction or gangrene: Secondary | ICD-10-CM | POA: Diagnosis not present

## 2015-04-09 DIAGNOSIS — Z7901 Long term (current) use of anticoagulants: Secondary | ICD-10-CM | POA: Diagnosis not present

## 2015-04-09 DIAGNOSIS — I10 Essential (primary) hypertension: Secondary | ICD-10-CM | POA: Diagnosis not present

## 2015-04-14 DIAGNOSIS — I4892 Unspecified atrial flutter: Secondary | ICD-10-CM | POA: Diagnosis not present

## 2015-04-14 DIAGNOSIS — R002 Palpitations: Secondary | ICD-10-CM | POA: Diagnosis not present

## 2015-04-14 DIAGNOSIS — R9431 Abnormal electrocardiogram [ECG] [EKG]: Secondary | ICD-10-CM | POA: Diagnosis not present

## 2015-04-16 DIAGNOSIS — L309 Dermatitis, unspecified: Secondary | ICD-10-CM

## 2015-04-16 DIAGNOSIS — G629 Polyneuropathy, unspecified: Secondary | ICD-10-CM

## 2015-04-16 DIAGNOSIS — T50905A Adverse effect of unspecified drugs, medicaments and biological substances, initial encounter: Secondary | ICD-10-CM | POA: Insufficient documentation

## 2015-04-16 DIAGNOSIS — I482 Chronic atrial fibrillation: Secondary | ICD-10-CM | POA: Diagnosis not present

## 2015-04-16 HISTORY — DX: Dermatitis, unspecified: L30.9

## 2015-04-16 HISTORY — DX: Polyneuropathy, unspecified: G62.9

## 2015-04-16 HISTORY — DX: Adverse effect of unspecified drugs, medicaments and biological substances, initial encounter: T50.905A

## 2015-04-18 DIAGNOSIS — I482 Chronic atrial fibrillation: Secondary | ICD-10-CM | POA: Diagnosis not present

## 2015-04-18 DIAGNOSIS — Z7901 Long term (current) use of anticoagulants: Secondary | ICD-10-CM | POA: Diagnosis not present

## 2015-04-19 DIAGNOSIS — I4892 Unspecified atrial flutter: Secondary | ICD-10-CM | POA: Diagnosis not present

## 2015-04-22 DIAGNOSIS — Z7901 Long term (current) use of anticoagulants: Secondary | ICD-10-CM | POA: Diagnosis not present

## 2015-04-22 DIAGNOSIS — I4892 Unspecified atrial flutter: Secondary | ICD-10-CM | POA: Diagnosis not present

## 2015-04-22 DIAGNOSIS — I482 Chronic atrial fibrillation: Secondary | ICD-10-CM | POA: Diagnosis not present

## 2015-04-29 DIAGNOSIS — I482 Chronic atrial fibrillation: Secondary | ICD-10-CM | POA: Diagnosis not present

## 2015-04-29 DIAGNOSIS — Z7901 Long term (current) use of anticoagulants: Secondary | ICD-10-CM | POA: Diagnosis not present

## 2015-05-01 DIAGNOSIS — E785 Hyperlipidemia, unspecified: Secondary | ICD-10-CM | POA: Diagnosis not present

## 2015-05-01 DIAGNOSIS — F419 Anxiety disorder, unspecified: Secondary | ICD-10-CM | POA: Diagnosis not present

## 2015-05-01 DIAGNOSIS — R110 Nausea: Secondary | ICD-10-CM | POA: Diagnosis not present

## 2015-05-01 DIAGNOSIS — K219 Gastro-esophageal reflux disease without esophagitis: Secondary | ICD-10-CM | POA: Diagnosis not present

## 2015-05-01 DIAGNOSIS — E039 Hypothyroidism, unspecified: Secondary | ICD-10-CM | POA: Diagnosis not present

## 2015-05-01 DIAGNOSIS — I10 Essential (primary) hypertension: Secondary | ICD-10-CM | POA: Diagnosis not present

## 2015-05-01 DIAGNOSIS — Z6826 Body mass index (BMI) 26.0-26.9, adult: Secondary | ICD-10-CM | POA: Diagnosis not present

## 2015-05-07 DIAGNOSIS — I471 Supraventricular tachycardia: Secondary | ICD-10-CM | POA: Diagnosis not present

## 2015-05-07 DIAGNOSIS — Z79899 Other long term (current) drug therapy: Secondary | ICD-10-CM | POA: Diagnosis not present

## 2015-05-07 DIAGNOSIS — Z7901 Long term (current) use of anticoagulants: Secondary | ICD-10-CM | POA: Diagnosis not present

## 2015-05-13 DIAGNOSIS — K5730 Diverticulosis of large intestine without perforation or abscess without bleeding: Secondary | ICD-10-CM | POA: Diagnosis not present

## 2015-05-13 DIAGNOSIS — K219 Gastro-esophageal reflux disease without esophagitis: Secondary | ICD-10-CM | POA: Diagnosis not present

## 2015-05-20 DIAGNOSIS — N83291 Other ovarian cyst, right side: Secondary | ICD-10-CM | POA: Diagnosis not present

## 2015-05-22 DIAGNOSIS — N83209 Unspecified ovarian cyst, unspecified side: Secondary | ICD-10-CM | POA: Diagnosis not present

## 2015-05-27 DIAGNOSIS — I471 Supraventricular tachycardia: Secondary | ICD-10-CM | POA: Diagnosis not present

## 2015-05-28 DIAGNOSIS — I8311 Varicose veins of right lower extremity with inflammation: Secondary | ICD-10-CM | POA: Diagnosis not present

## 2015-05-28 DIAGNOSIS — I8312 Varicose veins of left lower extremity with inflammation: Secondary | ICD-10-CM | POA: Diagnosis not present

## 2015-06-12 DIAGNOSIS — I4891 Unspecified atrial fibrillation: Secondary | ICD-10-CM | POA: Diagnosis not present

## 2015-06-12 DIAGNOSIS — I10 Essential (primary) hypertension: Secondary | ICD-10-CM | POA: Diagnosis not present

## 2015-06-12 DIAGNOSIS — E039 Hypothyroidism, unspecified: Secondary | ICD-10-CM | POA: Diagnosis not present

## 2015-06-12 DIAGNOSIS — E785 Hyperlipidemia, unspecified: Secondary | ICD-10-CM | POA: Diagnosis not present

## 2015-06-12 DIAGNOSIS — Z6826 Body mass index (BMI) 26.0-26.9, adult: Secondary | ICD-10-CM | POA: Diagnosis not present

## 2015-06-12 DIAGNOSIS — Z79899 Other long term (current) drug therapy: Secondary | ICD-10-CM | POA: Diagnosis not present

## 2015-06-24 DIAGNOSIS — I4891 Unspecified atrial fibrillation: Secondary | ICD-10-CM | POA: Diagnosis not present

## 2015-07-01 DIAGNOSIS — I482 Chronic atrial fibrillation: Secondary | ICD-10-CM | POA: Diagnosis not present

## 2015-07-01 DIAGNOSIS — Z7901 Long term (current) use of anticoagulants: Secondary | ICD-10-CM | POA: Diagnosis not present

## 2015-07-22 DIAGNOSIS — I4891 Unspecified atrial fibrillation: Secondary | ICD-10-CM | POA: Diagnosis not present

## 2015-08-21 DIAGNOSIS — Z7901 Long term (current) use of anticoagulants: Secondary | ICD-10-CM | POA: Diagnosis not present

## 2015-08-21 DIAGNOSIS — I480 Paroxysmal atrial fibrillation: Secondary | ICD-10-CM | POA: Diagnosis not present

## 2015-08-21 DIAGNOSIS — Z79899 Other long term (current) drug therapy: Secondary | ICD-10-CM | POA: Diagnosis not present

## 2015-08-21 DIAGNOSIS — I110 Hypertensive heart disease with heart failure: Secondary | ICD-10-CM | POA: Diagnosis not present

## 2015-08-23 DIAGNOSIS — I4891 Unspecified atrial fibrillation: Secondary | ICD-10-CM | POA: Diagnosis not present

## 2015-08-23 DIAGNOSIS — R0602 Shortness of breath: Secondary | ICD-10-CM | POA: Diagnosis not present

## 2015-08-29 NOTE — Progress Notes (Signed)
This encounter was created in error - please disregard.

## 2015-09-06 DIAGNOSIS — I482 Chronic atrial fibrillation: Secondary | ICD-10-CM | POA: Diagnosis not present

## 2015-09-11 DIAGNOSIS — N83291 Other ovarian cyst, right side: Secondary | ICD-10-CM | POA: Diagnosis not present

## 2015-09-18 DIAGNOSIS — I10 Essential (primary) hypertension: Secondary | ICD-10-CM | POA: Diagnosis not present

## 2015-09-18 DIAGNOSIS — I4891 Unspecified atrial fibrillation: Secondary | ICD-10-CM | POA: Diagnosis not present

## 2015-09-18 DIAGNOSIS — Z6826 Body mass index (BMI) 26.0-26.9, adult: Secondary | ICD-10-CM | POA: Diagnosis not present

## 2015-09-18 DIAGNOSIS — Z9181 History of falling: Secondary | ICD-10-CM | POA: Diagnosis not present

## 2015-09-18 DIAGNOSIS — Z1231 Encounter for screening mammogram for malignant neoplasm of breast: Secondary | ICD-10-CM | POA: Diagnosis not present

## 2015-09-18 DIAGNOSIS — E039 Hypothyroidism, unspecified: Secondary | ICD-10-CM | POA: Diagnosis not present

## 2015-09-18 DIAGNOSIS — E785 Hyperlipidemia, unspecified: Secondary | ICD-10-CM | POA: Diagnosis not present

## 2015-09-18 DIAGNOSIS — Z79899 Other long term (current) drug therapy: Secondary | ICD-10-CM | POA: Diagnosis not present

## 2015-09-18 DIAGNOSIS — K219 Gastro-esophageal reflux disease without esophagitis: Secondary | ICD-10-CM | POA: Diagnosis not present

## 2015-09-26 DIAGNOSIS — R002 Palpitations: Secondary | ICD-10-CM | POA: Diagnosis not present

## 2015-09-28 DIAGNOSIS — K5792 Diverticulitis of intestine, part unspecified, without perforation or abscess without bleeding: Secondary | ICD-10-CM | POA: Diagnosis not present

## 2015-09-28 DIAGNOSIS — R102 Pelvic and perineal pain: Secondary | ICD-10-CM | POA: Diagnosis not present

## 2015-10-01 DIAGNOSIS — R002 Palpitations: Secondary | ICD-10-CM | POA: Diagnosis not present

## 2015-10-31 DIAGNOSIS — K5792 Diverticulitis of intestine, part unspecified, without perforation or abscess without bleeding: Secondary | ICD-10-CM | POA: Diagnosis not present

## 2015-10-31 DIAGNOSIS — Z6827 Body mass index (BMI) 27.0-27.9, adult: Secondary | ICD-10-CM | POA: Diagnosis not present

## 2015-10-31 DIAGNOSIS — L270 Generalized skin eruption due to drugs and medicaments taken internally: Secondary | ICD-10-CM | POA: Diagnosis not present

## 2015-10-31 DIAGNOSIS — J019 Acute sinusitis, unspecified: Secondary | ICD-10-CM | POA: Diagnosis not present

## 2015-11-04 DIAGNOSIS — Z1231 Encounter for screening mammogram for malignant neoplasm of breast: Secondary | ICD-10-CM | POA: Diagnosis not present

## 2015-11-09 DIAGNOSIS — R109 Unspecified abdominal pain: Secondary | ICD-10-CM | POA: Diagnosis not present

## 2015-11-09 DIAGNOSIS — K5732 Diverticulitis of large intestine without perforation or abscess without bleeding: Secondary | ICD-10-CM | POA: Diagnosis not present

## 2015-11-09 DIAGNOSIS — N83201 Unspecified ovarian cyst, right side: Secondary | ICD-10-CM | POA: Diagnosis not present

## 2015-11-09 DIAGNOSIS — R55 Syncope and collapse: Secondary | ICD-10-CM | POA: Diagnosis not present

## 2015-11-10 DIAGNOSIS — E7800 Pure hypercholesterolemia, unspecified: Secondary | ICD-10-CM | POA: Diagnosis present

## 2015-11-10 DIAGNOSIS — M1990 Unspecified osteoarthritis, unspecified site: Secondary | ICD-10-CM | POA: Diagnosis present

## 2015-11-10 DIAGNOSIS — N83201 Unspecified ovarian cyst, right side: Secondary | ICD-10-CM | POA: Diagnosis not present

## 2015-11-10 DIAGNOSIS — I10 Essential (primary) hypertension: Secondary | ICD-10-CM | POA: Diagnosis present

## 2015-11-10 DIAGNOSIS — Z888 Allergy status to other drugs, medicaments and biological substances status: Secondary | ICD-10-CM | POA: Diagnosis not present

## 2015-11-10 DIAGNOSIS — R109 Unspecified abdominal pain: Secondary | ICD-10-CM | POA: Diagnosis not present

## 2015-11-10 DIAGNOSIS — E872 Acidosis: Secondary | ICD-10-CM | POA: Diagnosis not present

## 2015-11-10 DIAGNOSIS — I480 Paroxysmal atrial fibrillation: Secondary | ICD-10-CM | POA: Diagnosis present

## 2015-11-10 DIAGNOSIS — K2970 Gastritis, unspecified, without bleeding: Secondary | ICD-10-CM | POA: Diagnosis not present

## 2015-11-10 DIAGNOSIS — D638 Anemia in other chronic diseases classified elsewhere: Secondary | ICD-10-CM | POA: Diagnosis present

## 2015-11-10 DIAGNOSIS — R0902 Hypoxemia: Secondary | ICD-10-CM | POA: Diagnosis not present

## 2015-11-10 DIAGNOSIS — Z7902 Long term (current) use of antithrombotics/antiplatelets: Secondary | ICD-10-CM | POA: Diagnosis not present

## 2015-11-10 DIAGNOSIS — Z87891 Personal history of nicotine dependence: Secondary | ICD-10-CM | POA: Diagnosis not present

## 2015-11-10 DIAGNOSIS — Z880 Allergy status to penicillin: Secondary | ICD-10-CM | POA: Diagnosis not present

## 2015-11-10 DIAGNOSIS — E039 Hypothyroidism, unspecified: Secondary | ICD-10-CM | POA: Diagnosis present

## 2015-11-10 DIAGNOSIS — R55 Syncope and collapse: Secondary | ICD-10-CM | POA: Diagnosis not present

## 2015-11-10 DIAGNOSIS — Z79899 Other long term (current) drug therapy: Secondary | ICD-10-CM | POA: Diagnosis not present

## 2015-11-10 DIAGNOSIS — K5732 Diverticulitis of large intestine without perforation or abscess without bleeding: Secondary | ICD-10-CM | POA: Diagnosis present

## 2015-11-10 DIAGNOSIS — F419 Anxiety disorder, unspecified: Secondary | ICD-10-CM | POA: Diagnosis present

## 2015-11-10 DIAGNOSIS — I509 Heart failure, unspecified: Secondary | ICD-10-CM | POA: Diagnosis not present

## 2015-11-10 DIAGNOSIS — R112 Nausea with vomiting, unspecified: Secondary | ICD-10-CM | POA: Diagnosis not present

## 2015-11-10 DIAGNOSIS — E785 Hyperlipidemia, unspecified: Secondary | ICD-10-CM | POA: Diagnosis present

## 2015-11-15 DIAGNOSIS — R110 Nausea: Secondary | ICD-10-CM | POA: Diagnosis not present

## 2015-11-15 DIAGNOSIS — E871 Hypo-osmolality and hyponatremia: Secondary | ICD-10-CM | POA: Diagnosis not present

## 2015-11-15 DIAGNOSIS — R1084 Generalized abdominal pain: Secondary | ICD-10-CM | POA: Diagnosis not present

## 2015-11-15 DIAGNOSIS — K5792 Diverticulitis of intestine, part unspecified, without perforation or abscess without bleeding: Secondary | ICD-10-CM | POA: Diagnosis not present

## 2015-11-15 DIAGNOSIS — Z6826 Body mass index (BMI) 26.0-26.9, adult: Secondary | ICD-10-CM | POA: Diagnosis not present

## 2015-11-18 DIAGNOSIS — E871 Hypo-osmolality and hyponatremia: Secondary | ICD-10-CM | POA: Diagnosis not present

## 2015-11-18 DIAGNOSIS — K5792 Diverticulitis of intestine, part unspecified, without perforation or abscess without bleeding: Secondary | ICD-10-CM | POA: Diagnosis not present

## 2015-11-25 DIAGNOSIS — K5792 Diverticulitis of intestine, part unspecified, without perforation or abscess without bleeding: Secondary | ICD-10-CM | POA: Diagnosis not present

## 2015-11-25 DIAGNOSIS — E871 Hypo-osmolality and hyponatremia: Secondary | ICD-10-CM | POA: Diagnosis not present

## 2015-11-29 DIAGNOSIS — E039 Hypothyroidism, unspecified: Secondary | ICD-10-CM | POA: Diagnosis not present

## 2015-11-29 DIAGNOSIS — E663 Overweight: Secondary | ICD-10-CM | POA: Diagnosis not present

## 2015-11-29 DIAGNOSIS — R5383 Other fatigue: Secondary | ICD-10-CM | POA: Diagnosis not present

## 2015-11-29 DIAGNOSIS — K5792 Diverticulitis of intestine, part unspecified, without perforation or abscess without bleeding: Secondary | ICD-10-CM | POA: Diagnosis not present

## 2015-11-29 DIAGNOSIS — Z6825 Body mass index (BMI) 25.0-25.9, adult: Secondary | ICD-10-CM | POA: Diagnosis not present

## 2015-12-04 DIAGNOSIS — R1032 Left lower quadrant pain: Secondary | ICD-10-CM | POA: Diagnosis not present

## 2015-12-04 DIAGNOSIS — K529 Noninfective gastroenteritis and colitis, unspecified: Secondary | ICD-10-CM | POA: Diagnosis not present

## 2015-12-05 DIAGNOSIS — E039 Hypothyroidism, unspecified: Secondary | ICD-10-CM

## 2015-12-05 DIAGNOSIS — F419 Anxiety disorder, unspecified: Secondary | ICD-10-CM

## 2015-12-05 DIAGNOSIS — I10 Essential (primary) hypertension: Secondary | ICD-10-CM | POA: Insufficient documentation

## 2015-12-05 DIAGNOSIS — N816 Rectocele: Secondary | ICD-10-CM | POA: Insufficient documentation

## 2015-12-05 DIAGNOSIS — N83209 Unspecified ovarian cyst, unspecified side: Secondary | ICD-10-CM

## 2015-12-05 DIAGNOSIS — I5189 Other ill-defined heart diseases: Secondary | ICD-10-CM | POA: Insufficient documentation

## 2015-12-05 HISTORY — DX: Unspecified ovarian cyst, unspecified side: N83.209

## 2015-12-05 HISTORY — DX: Hypothyroidism, unspecified: E03.9

## 2015-12-05 HISTORY — DX: Rectocele: N81.6

## 2015-12-05 HISTORY — DX: Anxiety disorder, unspecified: F41.9

## 2015-12-10 DIAGNOSIS — N83201 Unspecified ovarian cyst, right side: Secondary | ICD-10-CM | POA: Diagnosis not present

## 2015-12-11 DIAGNOSIS — N83201 Unspecified ovarian cyst, right side: Secondary | ICD-10-CM | POA: Diagnosis not present

## 2015-12-24 DIAGNOSIS — Z79899 Other long term (current) drug therapy: Secondary | ICD-10-CM | POA: Diagnosis not present

## 2015-12-24 DIAGNOSIS — Z87891 Personal history of nicotine dependence: Secondary | ICD-10-CM | POA: Diagnosis not present

## 2015-12-24 DIAGNOSIS — Z1211 Encounter for screening for malignant neoplasm of colon: Secondary | ICD-10-CM | POA: Diagnosis not present

## 2015-12-24 DIAGNOSIS — K5730 Diverticulosis of large intestine without perforation or abscess without bleeding: Secondary | ICD-10-CM | POA: Diagnosis not present

## 2015-12-24 DIAGNOSIS — K648 Other hemorrhoids: Secondary | ICD-10-CM | POA: Diagnosis not present

## 2015-12-24 DIAGNOSIS — I4891 Unspecified atrial fibrillation: Secondary | ICD-10-CM | POA: Diagnosis not present

## 2015-12-24 DIAGNOSIS — I10 Essential (primary) hypertension: Secondary | ICD-10-CM | POA: Diagnosis not present

## 2016-01-09 DIAGNOSIS — H6121 Impacted cerumen, right ear: Secondary | ICD-10-CM | POA: Diagnosis not present

## 2016-01-09 DIAGNOSIS — M26621 Arthralgia of right temporomandibular joint: Secondary | ICD-10-CM | POA: Diagnosis not present

## 2016-01-09 DIAGNOSIS — I4891 Unspecified atrial fibrillation: Secondary | ICD-10-CM | POA: Diagnosis not present

## 2016-01-09 DIAGNOSIS — Z6825 Body mass index (BMI) 25.0-25.9, adult: Secondary | ICD-10-CM | POA: Diagnosis not present

## 2016-01-09 DIAGNOSIS — H9201 Otalgia, right ear: Secondary | ICD-10-CM | POA: Diagnosis not present

## 2016-01-21 DIAGNOSIS — R1031 Right lower quadrant pain: Secondary | ICD-10-CM | POA: Diagnosis not present

## 2016-01-21 DIAGNOSIS — Z6825 Body mass index (BMI) 25.0-25.9, adult: Secondary | ICD-10-CM | POA: Diagnosis not present

## 2016-01-21 DIAGNOSIS — K4090 Unilateral inguinal hernia, without obstruction or gangrene, not specified as recurrent: Secondary | ICD-10-CM | POA: Diagnosis not present

## 2016-01-29 DIAGNOSIS — K5730 Diverticulosis of large intestine without perforation or abscess without bleeding: Secondary | ICD-10-CM | POA: Diagnosis not present

## 2016-02-05 DIAGNOSIS — Z7901 Long term (current) use of anticoagulants: Secondary | ICD-10-CM | POA: Diagnosis not present

## 2016-02-05 DIAGNOSIS — I480 Paroxysmal atrial fibrillation: Secondary | ICD-10-CM | POA: Diagnosis not present

## 2016-02-05 DIAGNOSIS — I471 Supraventricular tachycardia: Secondary | ICD-10-CM | POA: Diagnosis not present

## 2016-02-05 DIAGNOSIS — Z79899 Other long term (current) drug therapy: Secondary | ICD-10-CM | POA: Diagnosis not present

## 2016-02-13 DIAGNOSIS — M205X1 Other deformities of toe(s) (acquired), right foot: Secondary | ICD-10-CM | POA: Diagnosis not present

## 2016-02-13 DIAGNOSIS — Z1389 Encounter for screening for other disorder: Secondary | ICD-10-CM | POA: Diagnosis not present

## 2016-02-13 DIAGNOSIS — M205X2 Other deformities of toe(s) (acquired), left foot: Secondary | ICD-10-CM | POA: Diagnosis not present

## 2016-02-13 DIAGNOSIS — Z6825 Body mass index (BMI) 25.0-25.9, adult: Secondary | ICD-10-CM | POA: Diagnosis not present

## 2016-02-13 DIAGNOSIS — M21611 Bunion of right foot: Secondary | ICD-10-CM | POA: Diagnosis not present

## 2016-02-26 DIAGNOSIS — I480 Paroxysmal atrial fibrillation: Secondary | ICD-10-CM | POA: Diagnosis not present

## 2016-02-27 DIAGNOSIS — I480 Paroxysmal atrial fibrillation: Secondary | ICD-10-CM | POA: Diagnosis not present

## 2016-02-27 DIAGNOSIS — I5022 Chronic systolic (congestive) heart failure: Secondary | ICD-10-CM | POA: Diagnosis not present

## 2016-02-27 DIAGNOSIS — I4892 Unspecified atrial flutter: Secondary | ICD-10-CM | POA: Diagnosis not present

## 2016-02-29 DIAGNOSIS — I4891 Unspecified atrial fibrillation: Secondary | ICD-10-CM | POA: Diagnosis not present

## 2016-02-29 DIAGNOSIS — I10 Essential (primary) hypertension: Secondary | ICD-10-CM | POA: Diagnosis not present

## 2016-02-29 DIAGNOSIS — F419 Anxiety disorder, unspecified: Secondary | ICD-10-CM | POA: Diagnosis not present

## 2016-03-04 ENCOUNTER — Ambulatory Visit (INDEPENDENT_AMBULATORY_CARE_PROVIDER_SITE_OTHER): Payer: Medicare Other

## 2016-03-04 ENCOUNTER — Encounter: Payer: Self-pay | Admitting: Sports Medicine

## 2016-03-04 ENCOUNTER — Ambulatory Visit (INDEPENDENT_AMBULATORY_CARE_PROVIDER_SITE_OTHER): Payer: Medicare Other | Admitting: Sports Medicine

## 2016-03-04 DIAGNOSIS — M79673 Pain in unspecified foot: Secondary | ICD-10-CM | POA: Diagnosis not present

## 2016-03-04 DIAGNOSIS — M21619 Bunion of unspecified foot: Secondary | ICD-10-CM | POA: Diagnosis not present

## 2016-03-04 DIAGNOSIS — R252 Cramp and spasm: Secondary | ICD-10-CM

## 2016-03-04 DIAGNOSIS — G609 Hereditary and idiopathic neuropathy, unspecified: Secondary | ICD-10-CM | POA: Diagnosis not present

## 2016-03-04 NOTE — Progress Notes (Signed)
Subjective: [name] is a 75 y.o. female patient who presents to office for evaluation of cramps that are sporatic and occassional in nature to both feet; reports that she told her PCP about this who recommended her to have her bunions checked. Cramp is with putting on shoes mostly. Patient reports that she has tried Cramp defense over the last 2 weeks which has helped greatly. Patient denies any other pedal complaints.   Patient Active Problem List   Diagnosis Date Noted  . Anxiety 12/05/2015  . Hypertension 12/05/2015  . Hypothyroid 12/05/2015  . Ovarian cyst 12/05/2015  . Rectocele 12/05/2015  . Eczema 04/16/2015  .  Possible Neuropathy 04/16/2015  . Drug reaction to Amoxicillin 04/16/2015  . Adverse effect of drug 04/16/2015  . Chronic anticoagulation 02/13/2015  . High risk medication use 02/13/2015  . Long term current use of anticoagulant therapy 02/13/2015  . PAF (paroxysmal atrial fibrillation) ([place]) 02/13/2015  . PAT (paroxysmal atrial tachycardia) ([place]) 02/13/2015  . Atrial flutter by electrocardiogram ([place]) 06/06/2014  . Encounter for monitoring sotalol therapy 06/06/2014  . Encounter for therapeutic drug level monitoring 06/06/2014  . Atrial fibrillation ([place]) 01/10/2014  . Congestive heart failure ([place]) 01/10/2014  . Esophageal reflux 01/10/2014  . Gastroesophageal reflux disease 01/10/2014  . Hyperlipidemia 01/10/2014  . Hypothyroidism 01/10/2014  . Urethral stricture 12/23/2012    Current Outpatient Prescriptions on File Prior to Visit  Medication Sig Dispense Refill  . cyclobenzaprine (FLEXERIL) 10 MG tablet Take 10 mg by mouth at bedtime.    Marland Kitchen diltiazem (CARDIZEM CD) 180 MG 24 hr capsule Take 180 mg by mouth daily. Daily at 1030 am    . HYDROcodone-acetaminophen (NORCO) 7.5-325 MG per tablet Take 1-2 tablets by mouth every 4 (four) hours as needed for pain. 40 tablet 0  . levothyroxine (SYNTHROID, LEVOTHROID) 75 MCG tablet Take 75 mcg by mouth daily before  breakfast.    . loratadine (CLARITIN) 10 MG tablet Take 10 mg by mouth as needed for allergies.    . pantoprazole (PROTONIX) 40 MG tablet Take 40 mg by mouth every evening.    . phenazopyridine (PYRIDIUM) 200 MG tablet Take 1 tablet (200 mg total) by mouth 3 (three) times daily as needed for pain. 30 tablet 0  . simvastatin (ZOCOR) 40 MG tablet Take 40 mg by mouth every evening.    . sotalol (BETAPACE) 80 MG tablet Take 40 mg by mouth 2 (two) times daily.    Marland Kitchen warfarin (COUMADIN) 1 MG tablet Take 1-1.5 mg by mouth as directed. Takes 1.5mg  on Monday and Thursday's     No current facility-administered medications on file prior to visit.     Allergies  Allergen Reactions  . Protamine Anaphylaxis  . Ace Inhibitors Cough  . Lisinopril Cough and Other (See Comments)  . Amoxicillin Rash  . Celebrex [Celecoxib] Rash  . Cleocin [Clindamycin Hcl] Rash  . Clindamycin Rash  . Diflucan [Fluconazole] Rash    Recently took without complication [id number]    Objective:  General: Alert and oriented x3 in no acute distress  Dermatology: No open lesions bilateral lower extremities, no webspace macerations, no ecchymosis bilateral, all nails x 10 are well manicured.  Vascular: Dorsalis Pedis and Posterior Tibial pedal pulses 1/4, Capillary Fill Time 4 seconds, (+) pedal hair growth bilateral, no edema, mild varicosities bilateral lower extremities, Temperature gradient within normal limits.  Neurology: Johney Maine sensation intact via light touch bilateral,Tinels sign bilateral.   Musculoskeletal: No tenderness to bunion deformities, + limitation without crepitus  with range of motion, deformity trackbound, No other reproducible symptoms. Subjective cramps improving in nature and occassional tingling in toes.  Xrays  Right and Left Foot    Impression: Intermetatarsal angle above normal limits suggestive grade 4 deformity R>L, no other acute findings.      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  -Patient to return to office as needed or sooner if condition worsens.  Landis Martins, DPM

## 2016-03-04 NOTE — Patient Instructions (Signed)
OTC Capsaicin Cream for nerve pain

## 2016-03-11 DIAGNOSIS — K5900 Constipation, unspecified: Secondary | ICD-10-CM | POA: Diagnosis not present

## 2016-03-11 DIAGNOSIS — K5730 Diverticulosis of large intestine without perforation or abscess without bleeding: Secondary | ICD-10-CM | POA: Diagnosis not present

## 2016-03-11 DIAGNOSIS — R1032 Left lower quadrant pain: Secondary | ICD-10-CM | POA: Diagnosis not present

## 2016-03-11 DIAGNOSIS — K219 Gastro-esophageal reflux disease without esophagitis: Secondary | ICD-10-CM | POA: Diagnosis not present

## 2016-03-17 DIAGNOSIS — I480 Paroxysmal atrial fibrillation: Secondary | ICD-10-CM | POA: Diagnosis not present

## 2016-03-17 DIAGNOSIS — E039 Hypothyroidism, unspecified: Secondary | ICD-10-CM | POA: Diagnosis not present

## 2016-03-17 DIAGNOSIS — I4891 Unspecified atrial fibrillation: Secondary | ICD-10-CM | POA: Diagnosis not present

## 2016-03-17 DIAGNOSIS — E785 Hyperlipidemia, unspecified: Secondary | ICD-10-CM | POA: Diagnosis not present

## 2016-03-17 DIAGNOSIS — Z6825 Body mass index (BMI) 25.0-25.9, adult: Secondary | ICD-10-CM | POA: Diagnosis not present

## 2016-03-17 DIAGNOSIS — I10 Essential (primary) hypertension: Secondary | ICD-10-CM | POA: Diagnosis not present

## 2016-03-17 DIAGNOSIS — Z79899 Other long term (current) drug therapy: Secondary | ICD-10-CM | POA: Diagnosis not present

## 2016-03-17 DIAGNOSIS — K219 Gastro-esophageal reflux disease without esophagitis: Secondary | ICD-10-CM | POA: Diagnosis not present

## 2016-03-23 DIAGNOSIS — Z79899 Other long term (current) drug therapy: Secondary | ICD-10-CM

## 2016-03-23 DIAGNOSIS — I471 Supraventricular tachycardia: Secondary | ICD-10-CM | POA: Diagnosis not present

## 2016-03-23 HISTORY — DX: Other long term (current) drug therapy: Z79.899

## 2016-03-26 DIAGNOSIS — E039 Hypothyroidism, unspecified: Secondary | ICD-10-CM | POA: Diagnosis not present

## 2016-03-26 DIAGNOSIS — F419 Anxiety disorder, unspecified: Secondary | ICD-10-CM | POA: Diagnosis not present

## 2016-03-26 DIAGNOSIS — R251 Tremor, unspecified: Secondary | ICD-10-CM | POA: Diagnosis not present

## 2016-03-26 DIAGNOSIS — Z6826 Body mass index (BMI) 26.0-26.9, adult: Secondary | ICD-10-CM | POA: Diagnosis not present

## 2016-03-30 DIAGNOSIS — I471 Supraventricular tachycardia: Secondary | ICD-10-CM | POA: Diagnosis not present

## 2016-04-14 DIAGNOSIS — I480 Paroxysmal atrial fibrillation: Secondary | ICD-10-CM | POA: Diagnosis not present

## 2016-04-14 DIAGNOSIS — I471 Supraventricular tachycardia: Secondary | ICD-10-CM | POA: Diagnosis not present

## 2016-04-21 DIAGNOSIS — R140 Abdominal distension (gaseous): Secondary | ICD-10-CM | POA: Diagnosis not present

## 2016-04-21 DIAGNOSIS — Z6826 Body mass index (BMI) 26.0-26.9, adult: Secondary | ICD-10-CM | POA: Diagnosis not present

## 2016-04-21 DIAGNOSIS — R110 Nausea: Secondary | ICD-10-CM | POA: Diagnosis not present

## 2016-04-29 DIAGNOSIS — N390 Urinary tract infection, site not specified: Secondary | ICD-10-CM | POA: Diagnosis not present

## 2016-04-29 DIAGNOSIS — K439 Ventral hernia without obstruction or gangrene: Secondary | ICD-10-CM | POA: Diagnosis not present

## 2016-04-29 DIAGNOSIS — Z6826 Body mass index (BMI) 26.0-26.9, adult: Secondary | ICD-10-CM | POA: Diagnosis not present

## 2016-04-29 DIAGNOSIS — K5792 Diverticulitis of intestine, part unspecified, without perforation or abscess without bleeding: Secondary | ICD-10-CM | POA: Diagnosis not present

## 2016-04-30 DIAGNOSIS — Z7901 Long term (current) use of anticoagulants: Secondary | ICD-10-CM | POA: Diagnosis not present

## 2016-04-30 DIAGNOSIS — Z79899 Other long term (current) drug therapy: Secondary | ICD-10-CM | POA: Diagnosis not present

## 2016-04-30 DIAGNOSIS — I471 Supraventricular tachycardia: Secondary | ICD-10-CM | POA: Diagnosis not present

## 2016-05-06 DIAGNOSIS — Z6826 Body mass index (BMI) 26.0-26.9, adult: Secondary | ICD-10-CM | POA: Diagnosis not present

## 2016-05-06 DIAGNOSIS — I10 Essential (primary) hypertension: Secondary | ICD-10-CM | POA: Diagnosis not present

## 2016-05-06 DIAGNOSIS — K439 Ventral hernia without obstruction or gangrene: Secondary | ICD-10-CM | POA: Diagnosis not present

## 2016-05-06 DIAGNOSIS — I4891 Unspecified atrial fibrillation: Secondary | ICD-10-CM | POA: Diagnosis not present

## 2016-05-11 DIAGNOSIS — R110 Nausea: Secondary | ICD-10-CM | POA: Diagnosis not present

## 2016-05-11 DIAGNOSIS — Z6826 Body mass index (BMI) 26.0-26.9, adult: Secondary | ICD-10-CM | POA: Diagnosis not present

## 2016-05-11 DIAGNOSIS — F419 Anxiety disorder, unspecified: Secondary | ICD-10-CM | POA: Diagnosis not present

## 2016-05-11 DIAGNOSIS — K439 Ventral hernia without obstruction or gangrene: Secondary | ICD-10-CM | POA: Diagnosis not present

## 2016-05-11 DIAGNOSIS — K449 Diaphragmatic hernia without obstruction or gangrene: Secondary | ICD-10-CM | POA: Diagnosis not present

## 2016-05-13 DIAGNOSIS — K439 Ventral hernia without obstruction or gangrene: Secondary | ICD-10-CM | POA: Diagnosis not present

## 2016-05-15 DIAGNOSIS — E039 Hypothyroidism, unspecified: Secondary | ICD-10-CM | POA: Diagnosis present

## 2016-05-15 DIAGNOSIS — Z7901 Long term (current) use of anticoagulants: Secondary | ICD-10-CM | POA: Diagnosis not present

## 2016-05-15 DIAGNOSIS — Z880 Allergy status to penicillin: Secondary | ICD-10-CM | POA: Diagnosis not present

## 2016-05-15 DIAGNOSIS — I4581 Long QT syndrome: Secondary | ICD-10-CM | POA: Diagnosis not present

## 2016-05-15 DIAGNOSIS — R4182 Altered mental status, unspecified: Secondary | ICD-10-CM | POA: Diagnosis not present

## 2016-05-15 DIAGNOSIS — R109 Unspecified abdominal pain: Secondary | ICD-10-CM | POA: Diagnosis not present

## 2016-05-15 DIAGNOSIS — E871 Hypo-osmolality and hyponatremia: Secondary | ICD-10-CM | POA: Diagnosis present

## 2016-05-15 DIAGNOSIS — K439 Ventral hernia without obstruction or gangrene: Secondary | ICD-10-CM | POA: Diagnosis present

## 2016-05-15 DIAGNOSIS — I509 Heart failure, unspecified: Secondary | ICD-10-CM | POA: Diagnosis present

## 2016-05-15 DIAGNOSIS — I471 Supraventricular tachycardia: Secondary | ICD-10-CM | POA: Diagnosis present

## 2016-05-15 DIAGNOSIS — Z87891 Personal history of nicotine dependence: Secondary | ICD-10-CM | POA: Diagnosis not present

## 2016-05-15 DIAGNOSIS — I480 Paroxysmal atrial fibrillation: Secondary | ICD-10-CM | POA: Diagnosis present

## 2016-05-15 DIAGNOSIS — R110 Nausea: Secondary | ICD-10-CM | POA: Diagnosis not present

## 2016-05-15 DIAGNOSIS — R112 Nausea with vomiting, unspecified: Secondary | ICD-10-CM | POA: Diagnosis not present

## 2016-05-15 DIAGNOSIS — I110 Hypertensive heart disease with heart failure: Secondary | ICD-10-CM | POA: Diagnosis present

## 2016-05-15 DIAGNOSIS — I517 Cardiomegaly: Secondary | ICD-10-CM | POA: Diagnosis not present

## 2016-05-15 DIAGNOSIS — E785 Hyperlipidemia, unspecified: Secondary | ICD-10-CM | POA: Diagnosis present

## 2016-05-15 DIAGNOSIS — G9341 Metabolic encephalopathy: Secondary | ICD-10-CM | POA: Diagnosis not present

## 2016-05-15 DIAGNOSIS — R631 Polydipsia: Secondary | ICD-10-CM | POA: Diagnosis not present

## 2016-05-15 DIAGNOSIS — E876 Hypokalemia: Secondary | ICD-10-CM | POA: Diagnosis present

## 2016-05-15 DIAGNOSIS — I10 Essential (primary) hypertension: Secondary | ICD-10-CM | POA: Diagnosis not present

## 2016-05-17 DIAGNOSIS — K439 Ventral hernia without obstruction or gangrene: Secondary | ICD-10-CM

## 2016-05-17 DIAGNOSIS — E871 Hypo-osmolality and hyponatremia: Secondary | ICD-10-CM

## 2016-05-17 HISTORY — DX: Hypo-osmolality and hyponatremia: E87.1

## 2016-05-17 HISTORY — DX: Ventral hernia without obstruction or gangrene: K43.9

## 2016-05-27 DIAGNOSIS — E871 Hypo-osmolality and hyponatremia: Secondary | ICD-10-CM | POA: Diagnosis not present

## 2016-05-27 DIAGNOSIS — Z6826 Body mass index (BMI) 26.0-26.9, adult: Secondary | ICD-10-CM | POA: Diagnosis not present

## 2016-05-27 DIAGNOSIS — Z79899 Other long term (current) drug therapy: Secondary | ICD-10-CM | POA: Diagnosis not present

## 2016-05-27 DIAGNOSIS — I4891 Unspecified atrial fibrillation: Secondary | ICD-10-CM | POA: Diagnosis not present

## 2016-05-27 DIAGNOSIS — E039 Hypothyroidism, unspecified: Secondary | ICD-10-CM | POA: Diagnosis not present

## 2016-05-27 DIAGNOSIS — I10 Essential (primary) hypertension: Secondary | ICD-10-CM | POA: Diagnosis not present

## 2016-06-18 DIAGNOSIS — E039 Hypothyroidism, unspecified: Secondary | ICD-10-CM | POA: Diagnosis not present

## 2016-06-18 DIAGNOSIS — I4891 Unspecified atrial fibrillation: Secondary | ICD-10-CM | POA: Diagnosis not present

## 2016-06-18 DIAGNOSIS — Z23 Encounter for immunization: Secondary | ICD-10-CM | POA: Diagnosis not present

## 2016-06-18 DIAGNOSIS — I10 Essential (primary) hypertension: Secondary | ICD-10-CM | POA: Diagnosis not present

## 2016-06-18 DIAGNOSIS — Z79899 Other long term (current) drug therapy: Secondary | ICD-10-CM | POA: Diagnosis not present

## 2016-06-18 DIAGNOSIS — Z6826 Body mass index (BMI) 26.0-26.9, adult: Secondary | ICD-10-CM | POA: Diagnosis not present

## 2016-06-18 DIAGNOSIS — E785 Hyperlipidemia, unspecified: Secondary | ICD-10-CM | POA: Diagnosis not present

## 2016-06-18 DIAGNOSIS — E871 Hypo-osmolality and hyponatremia: Secondary | ICD-10-CM | POA: Diagnosis not present

## 2016-06-18 DIAGNOSIS — K219 Gastro-esophageal reflux disease without esophagitis: Secondary | ICD-10-CM | POA: Diagnosis not present

## 2016-06-20 DIAGNOSIS — Z9229 Personal history of other drug therapy: Secondary | ICD-10-CM

## 2016-06-20 DIAGNOSIS — Z7901 Long term (current) use of anticoagulants: Secondary | ICD-10-CM

## 2016-06-20 HISTORY — DX: Long term (current) use of anticoagulants: Z79.01

## 2016-06-20 HISTORY — DX: Personal history of other drug therapy: Z92.29

## 2016-06-25 DIAGNOSIS — I481 Persistent atrial fibrillation: Secondary | ICD-10-CM | POA: Diagnosis not present

## 2016-06-25 DIAGNOSIS — Z8719 Personal history of other diseases of the digestive system: Secondary | ICD-10-CM | POA: Insufficient documentation

## 2016-06-25 HISTORY — DX: Personal history of other diseases of the digestive system: Z87.19

## 2016-07-07 DIAGNOSIS — F419 Anxiety disorder, unspecified: Secondary | ICD-10-CM | POA: Diagnosis not present

## 2016-07-07 DIAGNOSIS — Z6826 Body mass index (BMI) 26.0-26.9, adult: Secondary | ICD-10-CM | POA: Diagnosis not present

## 2016-07-07 DIAGNOSIS — J208 Acute bronchitis due to other specified organisms: Secondary | ICD-10-CM | POA: Diagnosis not present

## 2016-07-20 DIAGNOSIS — E039 Hypothyroidism, unspecified: Secondary | ICD-10-CM | POA: Diagnosis not present

## 2016-07-27 DIAGNOSIS — Z7901 Long term (current) use of anticoagulants: Secondary | ICD-10-CM | POA: Diagnosis not present

## 2016-07-27 DIAGNOSIS — I471 Supraventricular tachycardia: Secondary | ICD-10-CM | POA: Diagnosis not present

## 2016-07-27 DIAGNOSIS — I10 Essential (primary) hypertension: Secondary | ICD-10-CM | POA: Diagnosis not present

## 2016-08-12 DIAGNOSIS — Z7901 Long term (current) use of anticoagulants: Secondary | ICD-10-CM | POA: Diagnosis not present

## 2016-08-12 DIAGNOSIS — I471 Supraventricular tachycardia: Secondary | ICD-10-CM | POA: Diagnosis not present

## 2016-08-18 DIAGNOSIS — Z6826 Body mass index (BMI) 26.0-26.9, adult: Secondary | ICD-10-CM | POA: Diagnosis not present

## 2016-08-18 DIAGNOSIS — J019 Acute sinusitis, unspecified: Secondary | ICD-10-CM | POA: Diagnosis not present

## 2016-08-28 DIAGNOSIS — I4891 Unspecified atrial fibrillation: Secondary | ICD-10-CM | POA: Diagnosis not present

## 2016-08-31 DIAGNOSIS — I480 Paroxysmal atrial fibrillation: Secondary | ICD-10-CM | POA: Diagnosis not present

## 2016-09-01 DIAGNOSIS — I480 Paroxysmal atrial fibrillation: Secondary | ICD-10-CM | POA: Diagnosis not present

## 2016-09-01 DIAGNOSIS — Z7901 Long term (current) use of anticoagulants: Secondary | ICD-10-CM | POA: Diagnosis not present

## 2016-09-01 DIAGNOSIS — E785 Hyperlipidemia, unspecified: Secondary | ICD-10-CM | POA: Diagnosis not present

## 2016-09-01 DIAGNOSIS — K219 Gastro-esophageal reflux disease without esophagitis: Secondary | ICD-10-CM | POA: Diagnosis not present

## 2016-09-01 DIAGNOSIS — I509 Heart failure, unspecified: Secondary | ICD-10-CM | POA: Diagnosis not present

## 2016-09-01 DIAGNOSIS — I471 Supraventricular tachycardia: Secondary | ICD-10-CM | POA: Diagnosis not present

## 2016-09-01 DIAGNOSIS — I4892 Unspecified atrial flutter: Secondary | ICD-10-CM | POA: Diagnosis not present

## 2016-09-01 DIAGNOSIS — I110 Hypertensive heart disease with heart failure: Secondary | ICD-10-CM | POA: Diagnosis not present

## 2016-09-01 DIAGNOSIS — E039 Hypothyroidism, unspecified: Secondary | ICD-10-CM | POA: Diagnosis not present

## 2016-09-01 DIAGNOSIS — Z79899 Other long term (current) drug therapy: Secondary | ICD-10-CM | POA: Diagnosis not present

## 2016-09-08 DIAGNOSIS — N959 Unspecified menopausal and perimenopausal disorder: Secondary | ICD-10-CM | POA: Diagnosis not present

## 2016-09-08 DIAGNOSIS — Z136 Encounter for screening for cardiovascular disorders: Secondary | ICD-10-CM | POA: Diagnosis not present

## 2016-09-08 DIAGNOSIS — Z9181 History of falling: Secondary | ICD-10-CM | POA: Diagnosis not present

## 2016-09-08 DIAGNOSIS — Z1231 Encounter for screening mammogram for malignant neoplasm of breast: Secondary | ICD-10-CM | POA: Diagnosis not present

## 2016-09-08 DIAGNOSIS — Z1389 Encounter for screening for other disorder: Secondary | ICD-10-CM | POA: Diagnosis not present

## 2016-09-08 DIAGNOSIS — Z0000 Encounter for general adult medical examination without abnormal findings: Secondary | ICD-10-CM | POA: Diagnosis not present

## 2016-09-10 DIAGNOSIS — H40053 Ocular hypertension, bilateral: Secondary | ICD-10-CM | POA: Diagnosis not present

## 2016-09-10 DIAGNOSIS — H2513 Age-related nuclear cataract, bilateral: Secondary | ICD-10-CM | POA: Diagnosis not present

## 2016-09-16 DIAGNOSIS — I4891 Unspecified atrial fibrillation: Secondary | ICD-10-CM | POA: Diagnosis not present

## 2016-09-18 DIAGNOSIS — N816 Rectocele: Secondary | ICD-10-CM | POA: Diagnosis not present

## 2016-09-18 DIAGNOSIS — N359 Urethral stricture, unspecified: Secondary | ICD-10-CM | POA: Diagnosis not present

## 2016-09-21 DIAGNOSIS — Z79899 Other long term (current) drug therapy: Secondary | ICD-10-CM | POA: Diagnosis not present

## 2016-09-21 DIAGNOSIS — E039 Hypothyroidism, unspecified: Secondary | ICD-10-CM | POA: Diagnosis not present

## 2016-09-21 DIAGNOSIS — E785 Hyperlipidemia, unspecified: Secondary | ICD-10-CM | POA: Diagnosis not present

## 2016-09-21 DIAGNOSIS — I10 Essential (primary) hypertension: Secondary | ICD-10-CM | POA: Diagnosis not present

## 2016-09-21 DIAGNOSIS — I4891 Unspecified atrial fibrillation: Secondary | ICD-10-CM | POA: Diagnosis not present

## 2016-09-21 DIAGNOSIS — E871 Hypo-osmolality and hyponatremia: Secondary | ICD-10-CM | POA: Diagnosis not present

## 2016-09-21 DIAGNOSIS — K219 Gastro-esophageal reflux disease without esophagitis: Secondary | ICD-10-CM | POA: Diagnosis not present

## 2016-09-21 DIAGNOSIS — M8589 Other specified disorders of bone density and structure, multiple sites: Secondary | ICD-10-CM | POA: Diagnosis not present

## 2016-10-06 DIAGNOSIS — N816 Rectocele: Secondary | ICD-10-CM | POA: Diagnosis not present

## 2016-10-08 ENCOUNTER — Other Ambulatory Visit: Payer: Self-pay | Admitting: Urology

## 2016-10-08 DIAGNOSIS — H40053 Ocular hypertension, bilateral: Secondary | ICD-10-CM | POA: Diagnosis not present

## 2016-10-08 DIAGNOSIS — H2513 Age-related nuclear cataract, bilateral: Secondary | ICD-10-CM | POA: Diagnosis not present

## 2016-10-08 DIAGNOSIS — H524 Presbyopia: Secondary | ICD-10-CM | POA: Diagnosis not present

## 2016-10-09 ENCOUNTER — Encounter (HOSPITAL_BASED_OUTPATIENT_CLINIC_OR_DEPARTMENT_OTHER): Payer: Self-pay | Admitting: *Deleted

## 2016-10-09 NOTE — Progress Notes (Signed)
NPO AFTER MN.  ARRIVE AT 0745.  NEEDS ISTAT.  CURRENT EKG AND LOV NOTE TO BE FAXED FROM DR [facility] ([place], [phone]).  WILL TAKE RYTHMAL, CARDIZEM, AND SYNTHROID AM DOS W/ SIPS OF WATER.

## 2016-10-12 DIAGNOSIS — I482 Chronic atrial fibrillation: Secondary | ICD-10-CM | POA: Diagnosis not present

## 2016-10-15 DIAGNOSIS — Z79899 Other long term (current) drug therapy: Secondary | ICD-10-CM

## 2016-10-15 DIAGNOSIS — I481 Persistent atrial fibrillation: Secondary | ICD-10-CM | POA: Diagnosis not present

## 2016-10-15 DIAGNOSIS — I4892 Unspecified atrial flutter: Secondary | ICD-10-CM | POA: Diagnosis not present

## 2016-10-15 HISTORY — DX: Other long term (current) drug therapy: Z79.899

## 2016-10-16 ENCOUNTER — Ambulatory Visit (HOSPITAL_BASED_OUTPATIENT_CLINIC_OR_DEPARTMENT_OTHER): Admit: 2016-10-16 | Payer: Medicare Other | Admitting: Urology

## 2016-10-16 HISTORY — DX: Unspecified atrial flutter: I48.92

## 2016-10-16 HISTORY — DX: Unspecified systolic (congestive) heart failure: I50.20

## 2016-10-16 HISTORY — DX: Presence of spectacles and contact lenses: Z97.3

## 2016-10-16 HISTORY — DX: Supraventricular tachycardia: I47.1

## 2016-10-16 HISTORY — DX: Other supraventricular tachycardia: I47.19

## 2016-10-16 HISTORY — DX: Atrioventricular block, first degree: I44.0

## 2016-10-16 HISTORY — DX: Personal history of other diseases of the digestive system: Z87.19

## 2016-10-16 HISTORY — DX: Reserved for concepts with insufficient information to code with codable children: IMO0002

## 2016-10-16 HISTORY — DX: Other congenital malformations of great veins: Q26.8

## 2016-10-16 SURGERY — BALLOON DILATION
Anesthesia: Choice

## 2016-10-17 DIAGNOSIS — K5792 Diverticulitis of intestine, part unspecified, without perforation or abscess without bleeding: Secondary | ICD-10-CM | POA: Diagnosis not present

## 2016-10-19 DIAGNOSIS — E871 Hypo-osmolality and hyponatremia: Secondary | ICD-10-CM | POA: Diagnosis not present

## 2016-10-21 DIAGNOSIS — I484 Atypical atrial flutter: Secondary | ICD-10-CM

## 2016-10-21 HISTORY — DX: Atypical atrial flutter: I48.4

## 2016-10-22 DIAGNOSIS — N359 Urethral stricture, unspecified: Secondary | ICD-10-CM | POA: Diagnosis not present

## 2016-10-23 DIAGNOSIS — R339 Retention of urine, unspecified: Secondary | ICD-10-CM | POA: Diagnosis not present

## 2016-10-26 DIAGNOSIS — N393 Stress incontinence (female) (male): Secondary | ICD-10-CM | POA: Diagnosis not present

## 2016-10-30 DIAGNOSIS — I481 Persistent atrial fibrillation: Secondary | ICD-10-CM | POA: Diagnosis not present

## 2016-11-02 DIAGNOSIS — K219 Gastro-esophageal reflux disease without esophagitis: Secondary | ICD-10-CM | POA: Diagnosis not present

## 2016-11-02 DIAGNOSIS — I509 Heart failure, unspecified: Secondary | ICD-10-CM | POA: Diagnosis not present

## 2016-11-02 DIAGNOSIS — I481 Persistent atrial fibrillation: Secondary | ICD-10-CM | POA: Diagnosis not present

## 2016-11-02 DIAGNOSIS — I484 Atypical atrial flutter: Secondary | ICD-10-CM | POA: Diagnosis not present

## 2016-11-02 DIAGNOSIS — I480 Paroxysmal atrial fibrillation: Secondary | ICD-10-CM | POA: Diagnosis not present

## 2016-11-02 DIAGNOSIS — Z7901 Long term (current) use of anticoagulants: Secondary | ICD-10-CM | POA: Diagnosis not present

## 2016-11-02 DIAGNOSIS — I4892 Unspecified atrial flutter: Secondary | ICD-10-CM | POA: Diagnosis not present

## 2016-11-02 DIAGNOSIS — Z87891 Personal history of nicotine dependence: Secondary | ICD-10-CM | POA: Diagnosis not present

## 2016-11-02 DIAGNOSIS — I110 Hypertensive heart disease with heart failure: Secondary | ICD-10-CM | POA: Diagnosis not present

## 2016-11-03 DIAGNOSIS — Z7901 Long term (current) use of anticoagulants: Secondary | ICD-10-CM | POA: Diagnosis not present

## 2016-11-03 DIAGNOSIS — I509 Heart failure, unspecified: Secondary | ICD-10-CM | POA: Diagnosis not present

## 2016-11-03 DIAGNOSIS — I481 Persistent atrial fibrillation: Secondary | ICD-10-CM | POA: Diagnosis not present

## 2016-11-03 DIAGNOSIS — I480 Paroxysmal atrial fibrillation: Secondary | ICD-10-CM | POA: Diagnosis not present

## 2016-11-03 DIAGNOSIS — K219 Gastro-esophageal reflux disease without esophagitis: Secondary | ICD-10-CM | POA: Diagnosis not present

## 2016-11-03 DIAGNOSIS — I4892 Unspecified atrial flutter: Secondary | ICD-10-CM | POA: Diagnosis not present

## 2016-11-03 DIAGNOSIS — I110 Hypertensive heart disease with heart failure: Secondary | ICD-10-CM | POA: Diagnosis not present

## 2016-11-03 DIAGNOSIS — I484 Atypical atrial flutter: Secondary | ICD-10-CM | POA: Diagnosis not present

## 2016-11-11 DIAGNOSIS — F419 Anxiety disorder, unspecified: Secondary | ICD-10-CM | POA: Diagnosis not present

## 2016-11-11 DIAGNOSIS — I484 Atypical atrial flutter: Secondary | ICD-10-CM | POA: Diagnosis not present

## 2016-11-11 DIAGNOSIS — I110 Hypertensive heart disease with heart failure: Secondary | ICD-10-CM | POA: Diagnosis not present

## 2016-11-11 DIAGNOSIS — Z7901 Long term (current) use of anticoagulants: Secondary | ICD-10-CM | POA: Diagnosis not present

## 2016-11-13 DIAGNOSIS — M8580 Other specified disorders of bone density and structure, unspecified site: Secondary | ICD-10-CM | POA: Diagnosis not present

## 2016-11-13 DIAGNOSIS — Z1231 Encounter for screening mammogram for malignant neoplasm of breast: Secondary | ICD-10-CM | POA: Diagnosis not present

## 2016-11-13 DIAGNOSIS — N959 Unspecified menopausal and perimenopausal disorder: Secondary | ICD-10-CM | POA: Diagnosis not present

## 2016-11-13 DIAGNOSIS — M85852 Other specified disorders of bone density and structure, left thigh: Secondary | ICD-10-CM | POA: Diagnosis not present

## 2016-11-23 DIAGNOSIS — I480 Paroxysmal atrial fibrillation: Secondary | ICD-10-CM | POA: Diagnosis not present

## 2016-11-23 DIAGNOSIS — I471 Supraventricular tachycardia: Secondary | ICD-10-CM | POA: Diagnosis not present

## 2016-12-08 DIAGNOSIS — Z9889 Other specified postprocedural states: Secondary | ICD-10-CM | POA: Diagnosis not present

## 2016-12-08 DIAGNOSIS — I480 Paroxysmal atrial fibrillation: Secondary | ICD-10-CM | POA: Diagnosis not present

## 2016-12-08 DIAGNOSIS — Z8679 Personal history of other diseases of the circulatory system: Secondary | ICD-10-CM | POA: Diagnosis not present

## 2016-12-12 DIAGNOSIS — R531 Weakness: Secondary | ICD-10-CM | POA: Diagnosis not present

## 2016-12-12 DIAGNOSIS — I4891 Unspecified atrial fibrillation: Secondary | ICD-10-CM | POA: Diagnosis not present

## 2016-12-12 DIAGNOSIS — E871 Hypo-osmolality and hyponatremia: Secondary | ICD-10-CM | POA: Diagnosis not present

## 2016-12-21 DIAGNOSIS — E785 Hyperlipidemia, unspecified: Secondary | ICD-10-CM | POA: Diagnosis not present

## 2016-12-21 DIAGNOSIS — F419 Anxiety disorder, unspecified: Secondary | ICD-10-CM | POA: Diagnosis not present

## 2016-12-21 DIAGNOSIS — E039 Hypothyroidism, unspecified: Secondary | ICD-10-CM | POA: Diagnosis not present

## 2016-12-21 DIAGNOSIS — I4891 Unspecified atrial fibrillation: Secondary | ICD-10-CM | POA: Diagnosis not present

## 2016-12-21 DIAGNOSIS — I10 Essential (primary) hypertension: Secondary | ICD-10-CM | POA: Diagnosis not present

## 2016-12-21 DIAGNOSIS — E871 Hypo-osmolality and hyponatremia: Secondary | ICD-10-CM | POA: Diagnosis not present

## 2016-12-21 DIAGNOSIS — Z79899 Other long term (current) drug therapy: Secondary | ICD-10-CM | POA: Diagnosis not present

## 2016-12-21 DIAGNOSIS — K219 Gastro-esophageal reflux disease without esophagitis: Secondary | ICD-10-CM | POA: Diagnosis not present

## 2017-01-18 DIAGNOSIS — R3129 Other microscopic hematuria: Secondary | ICD-10-CM | POA: Diagnosis not present

## 2017-01-18 DIAGNOSIS — R1031 Right lower quadrant pain: Secondary | ICD-10-CM | POA: Diagnosis not present

## 2017-01-18 DIAGNOSIS — R1032 Left lower quadrant pain: Secondary | ICD-10-CM | POA: Diagnosis not present

## 2017-01-18 DIAGNOSIS — K5730 Diverticulosis of large intestine without perforation or abscess without bleeding: Secondary | ICD-10-CM | POA: Diagnosis not present

## 2017-01-18 DIAGNOSIS — Z6826 Body mass index (BMI) 26.0-26.9, adult: Secondary | ICD-10-CM | POA: Diagnosis not present

## 2017-01-25 DIAGNOSIS — N3001 Acute cystitis with hematuria: Secondary | ICD-10-CM | POA: Diagnosis not present

## 2017-01-25 DIAGNOSIS — R311 Benign essential microscopic hematuria: Secondary | ICD-10-CM | POA: Diagnosis not present

## 2017-01-25 DIAGNOSIS — N816 Rectocele: Secondary | ICD-10-CM | POA: Diagnosis not present

## 2017-01-25 DIAGNOSIS — N358 Other urethral stricture: Secondary | ICD-10-CM | POA: Diagnosis not present

## 2017-01-28 DIAGNOSIS — I480 Paroxysmal atrial fibrillation: Secondary | ICD-10-CM | POA: Diagnosis not present

## 2017-01-28 DIAGNOSIS — I4892 Unspecified atrial flutter: Secondary | ICD-10-CM | POA: Diagnosis not present

## 2017-01-28 DIAGNOSIS — I484 Atypical atrial flutter: Secondary | ICD-10-CM | POA: Diagnosis not present

## 2017-02-01 DIAGNOSIS — N3021 Other chronic cystitis with hematuria: Secondary | ICD-10-CM | POA: Diagnosis not present

## 2017-02-01 DIAGNOSIS — R3121 Asymptomatic microscopic hematuria: Secondary | ICD-10-CM | POA: Diagnosis not present

## 2017-02-01 DIAGNOSIS — N9912 Postprocedural urethral stricture, female: Secondary | ICD-10-CM | POA: Diagnosis not present

## 2017-02-01 DIAGNOSIS — N816 Rectocele: Secondary | ICD-10-CM | POA: Diagnosis not present

## 2017-02-12 ENCOUNTER — Encounter: Payer: Self-pay | Admitting: Cardiology

## 2017-02-19 DIAGNOSIS — F419 Anxiety disorder, unspecified: Secondary | ICD-10-CM | POA: Diagnosis not present

## 2017-02-19 DIAGNOSIS — Z6826 Body mass index (BMI) 26.0-26.9, adult: Secondary | ICD-10-CM | POA: Diagnosis not present

## 2017-02-24 DIAGNOSIS — R110 Nausea: Secondary | ICD-10-CM | POA: Diagnosis not present

## 2017-02-24 DIAGNOSIS — R232 Flushing: Secondary | ICD-10-CM | POA: Diagnosis not present

## 2017-02-24 DIAGNOSIS — T889XXA Complication of surgical and medical care, unspecified, initial encounter: Secondary | ICD-10-CM | POA: Diagnosis not present

## 2017-02-24 DIAGNOSIS — Z6826 Body mass index (BMI) 26.0-26.9, adult: Secondary | ICD-10-CM | POA: Diagnosis not present

## 2017-02-24 DIAGNOSIS — R6883 Chills (without fever): Secondary | ICD-10-CM | POA: Diagnosis not present

## 2017-02-24 DIAGNOSIS — F419 Anxiety disorder, unspecified: Secondary | ICD-10-CM | POA: Diagnosis not present

## 2017-03-01 DIAGNOSIS — N816 Rectocele: Secondary | ICD-10-CM | POA: Diagnosis not present

## 2017-03-01 DIAGNOSIS — I4891 Unspecified atrial fibrillation: Secondary | ICD-10-CM | POA: Diagnosis not present

## 2017-03-02 DIAGNOSIS — R200 Anesthesia of skin: Secondary | ICD-10-CM | POA: Insufficient documentation

## 2017-03-02 HISTORY — DX: Anesthesia of skin: R20.0

## 2017-03-03 ENCOUNTER — Encounter: Payer: Self-pay | Admitting: Cardiology

## 2017-03-03 ENCOUNTER — Ambulatory Visit (INDEPENDENT_AMBULATORY_CARE_PROVIDER_SITE_OTHER): Payer: Medicare Other | Admitting: Cardiology

## 2017-03-03 VITALS — BP 134/72 | HR 62 | Ht 61.0 in | Wt 139.0 lb

## 2017-03-03 DIAGNOSIS — N816 Rectocele: Secondary | ICD-10-CM | POA: Diagnosis not present

## 2017-03-03 DIAGNOSIS — I5043 Acute on chronic combined systolic (congestive) and diastolic (congestive) heart failure: Secondary | ICD-10-CM

## 2017-03-03 DIAGNOSIS — Z01810 Encounter for preprocedural cardiovascular examination: Secondary | ICD-10-CM

## 2017-03-03 DIAGNOSIS — I480 Paroxysmal atrial fibrillation: Secondary | ICD-10-CM

## 2017-03-03 DIAGNOSIS — I484 Atypical atrial flutter: Secondary | ICD-10-CM | POA: Diagnosis not present

## 2017-03-03 DIAGNOSIS — I10 Essential (primary) hypertension: Secondary | ICD-10-CM

## 2017-03-03 DIAGNOSIS — Z7901 Long term (current) use of anticoagulants: Secondary | ICD-10-CM | POA: Diagnosis not present

## 2017-03-03 DIAGNOSIS — E782 Mixed hyperlipidemia: Secondary | ICD-10-CM | POA: Diagnosis not present

## 2017-03-03 HISTORY — DX: Encounter for preprocedural cardiovascular examination: Z01.810

## 2017-03-03 NOTE — Progress Notes (Signed)
Cardiology Office Note:    Date:  03/03/2017   ID:  [name], DOB [birth year], MRN [id number]  PCP:  Nicoletta Dress, MD  Cardiologist:  Jenean Lindau, MD   Referring MD: Nicoletta Dress, MD    ASSESSMENT:    1. PAF (paroxysmal atrial fibrillation) ([place])   2. Acute on chronic combined systolic and diastolic CHF (congestive heart failure) ([place])   3. Preop cardiovascular exam   4. Paroxysmal atrial fibrillation (HCC)   5. Atypical atrial flutter (HCC)   6. Essential hypertension   7. Rectocele   8. Current use of long term anticoagulation   9. Mixed hyperlipidemia   10. Pre-operative cardiovascular examination    PLAN:    In order of problems listed above:  1. I discussed my findings with the patient at extensive length.I discussed with the patient atrial fibrillation, disease process. Management and therapy including rate and rhythm control, anticoagulation benefits and potential risks were discussed extensively with the patient. Patient had multiple questions which were answered to patient's satisfaction. 2. The patient currently is in sinus rhythm. She mentions to me that she takes diltiazem twice a day and this is relieved with palpitations. In view of multiple risk factors for coronary artery disease she will undergo Lexiscan sestamibi testing. If this is negative, she is not at high risk for coronary events during the aforementioned surgery. Meticulous hemodynamic monitoring and continuous calcium channel blocker will further reduce the risk of arrhythmic and cardiac events. Diet was discussed with dyslipidemia. Importance of regular exercise stress she will vocalized understanding. Anticoagulation issues were discussed and this will be managed by her surgeon in the perioperative period. 3. Patient will be seen in follow-up appointment in 6 months or earlier if the patient has any concerns.    Medication Adjustments/Labs and Tests Ordered: Current medicines are  reviewed at length with the patient today.  Concerns regarding medicines are outlined above.  Orders Placed This Encounter  Procedures  . Myocardial Perfusion Imaging   No orders of the defined types were placed in this encounter.    History of Present Illness:    [name] is a 76 y.o. female who is being seen today for the evaluation of Preop cardiovascular risk stratification at the request of Nicoletta Dress, MD. Patient is a pleasant 76 year old female. She was under the care of my partner at the previous practice. She has now decided to transfer her care to this current practice and wants to get established here. She has history of paroxysmal atrial fibrillation/atypical atrial flutter. She has undergone ablation 4, twice at [facility] but she says she still has palpitations at times. No chest pain orthopnea or PND. She has history of essential hypertension. She leads a sedentary lifestyle. She is active but does not exercise on a regular basis. No chest pain orthopnea or PND. At the time of my evaluation she is alert awake oriented and in no distress. She plans to undergo a rectocele surgery and is here for preoperative risk stratification.  Past Medical History:  Diagnosis Date  .  Possible Neuropathy 04/16/2015  . Adverse effect of drug 04/16/2015  . Anesthesia to pain 03/02/2017   Overview:  reaction to Protamine with 2nd cardiac ablation.  . Anticoagulant long-term use    ELIQUIS  . Anticoagulated 06/20/2016  . Anxiety 12/05/2015  . Atrial fibrillation ([place]) 01/10/2014  . Atrial flutter by electrocardiogram ([place]) 06/06/2014  . Atrial flutter, paroxysmal ([place])   . Atypical atrial  flutter ([place]) 10/21/2016  . Chronic anticoagulation 02/13/2015  . Congenital anomaly of pulmonary vein    per Cardiac [facility] at [facility]-16-[facility]-- noted 5 pulmonary veins (3 on the right and 2 on the left ) to left atrium  . Congestive heart failure ([place]) 01/10/2014  . Current use of long  term anticoagulation 02/13/2015  . Drug reaction to Amoxicillin 04/16/2015  . Eczema 04/16/2015  . Encounter for monitoring sotalol therapy 06/06/2014  . Encounter for therapeutic drug level monitoring 06/06/2014  . Esophageal reflux 01/10/2014  . First degree heart block   . Gastroesophageal reflux disease 01/10/2014  . GERD (gastroesophageal reflux disease)   . High risk medication use 02/13/2015   Overview:  Ticosyn  . History of amiodarone therapy 06/20/2016  . History of diverticulitis 06/25/2016  . History of diverticulitis of colon    04/ 2017  . Hyperlipidemia   . Hypertension   . Hyponatremia 05/17/2016  . Hypothyroid 12/05/2015  . Hypothyroidism   . Hypothyroidism (acquired) 01/10/2014  . Long term current use of antiarrhythmic drug 10/15/2016  . On amiodarone therapy 03/23/2016  . Ovarian cyst 12/05/2015  . PAF (paroxysmal atrial fibrillation) [facility]) cardiologist- dr Bettina Gavia [place])  . Paroxysmal atrial fibrillation ([place]) 02/13/2015   Overview:  had pulmonary vein ablation for AF in 2010 and redo for recurrence in April 2012  * CHADS2 score=2  Overview:  had pulmonary vein ablation for AF in 2010 and redo for recurrence in April 2012  * CHADS2 score=2  . PAT (paroxysmal atrial tachycardia) ([place])   . Rectocele 12/05/2015  . S/P ablation of atrial fibrillation 2011  &  2012  &  02-19-2014 at [place] cardiologist-- dr Norm Salt ([place])  . Systolic CHF ([place])   . Urethral stenosis   . Urethral stricture   . Ventral hernia 05/17/2016  . Wears glasses     Past Surgical History:  Procedure Laterality Date  . ABDOMINAL HERNIA REPAIR  10/ 2017   at [facility]  and 1989  . ABDOMINAL HYSTERECTOMY  1986  . ANTERIOR REPAIR AND TRANSOBTURATOR SLING  09-08-2010   CYSTOCELE AND SUI  . CARDIAC CATHETERIZATION  1991 ([facility]) &  2006 (dr Lia Foyer)   lvf preserved/ 45-50% mid rca/  40% lad after takeoff diagonal branch  . CARDIAC ELECTROPHYSIOLOGY STUDY AND ABLATION  x3 2011  &  2012  [facility] and 02-19-2014 at [place]   per EP study report 02-19-2014-- re-do PVI (LIPV & RSPV re-isolated) left atrial roof line & CTI line performed  . CARDIOVASCULAR STRESS TEST  04-08-2009     normal study/ ef 65%  . CARDIOVERSION  last one 09-01-2016  in [place]  . CYSTOSCOPY WITH URETHRAL DILATATION N/A 12/23/2012   Procedure: CYSTOSCOPY WITH BALLOON DILATATION;  Surgeon: Claybon Jabs, MD;  Location: [facility];  Service: Urology;  Laterality: N/A;  . TRANSESOPHAGEAL ECHOCARDIOGRAM  05-20-2009   normal lvf and structure/ ef 60-65%/ no thrombus  . TUBAL LIGATION  age 32    Current Medications: Current Meds  Medication Sig  . acetaminophen (TYLENOL) 500 MG tablet Take by mouth every 6 (six) hours as needed.   . ALPRAZolam (XANAX) 0.25 MG tablet Take 0.25 mg by mouth 2 (two) times daily as needed.   . dicyclomine (BENTYL) 10 MG capsule Take 10 mg by mouth as needed.   . diltiazem (CARDIZEM CD) 180 MG 24 hr capsule Take 180 mg by mouth 2 (two) times daily.  Takes at [place]  . ELIQUIS 5 MG TABS tablet Take 5 mg by mouth 2 (two) times daily. Pt aware to stop 3 days prior to [facility] 10-16-2016  . levothyroxine (SYNTHROID, LEVOTHROID) 100 MCG tablet Take 100 mcg by mouth daily before breakfast.  . loratadine (CLARITIN) 10 MG tablet Take 10 mg by mouth as needed for allergies.  Marland Kitchen losartan (COZAAR) 100 MG tablet TK 1 T PO HS  . magnesium hydroxide (MILK OF MAGNESIA) 400 MG/5ML suspension Take by mouth daily as needed for mild constipation.  . pantoprazole (PROTONIX) 40 MG tablet TK 1 T PO D-- takes in afternoon  . simvastatin (ZOCOR) 40 MG tablet TK 1 T PO HS     Allergies:   Amiodarone; Protamine; Amoxicillin; Celebrex [celecoxib]; Clindamycin; Lisinopril; Ace inhibitors; Cleocin [clindamycin hcl]; and Diflucan [fluconazole]   Social History   Social History  . Marital status: Widowed    Spouse name: N/A  . Number of children: N/A  . Years of education: N/A   Social  History Main Topics  . Smoking status: Former Smoker    Packs/day: 0.75    Years: 30.00    Types: Cigarettes    Quit date: 12/21/2004  . Smokeless tobacco: Never Used  . Alcohol use No  . Drug use: No  . Sexual activity: Not Asked   Other Topics Concern  . None   Social History Narrative  . None     Family History: The patient's family history is not on file.  ROS:   Please see the history of present illness.    All other systems reviewed and are negative.  EKGs/Labs/Other Studies Reviewed:    The following studies were reviewed today: I reviewed the records from referring doctor and previous records from [facility] and. I discussed these issues with her at length. EKG done today reveals sinus rhythm and nonspecific ST-T changes. The T-wave inversions were seen in the anterolateral leads. No prior EKG to compare here at this time.   Recent Labs: No results found for requested labs within last 8760 hours.  Recent Lipid Panel No results found for: CHOL, TRIG, HDL, CHOLHDL, VLDL, LDLCALC, LDLDIRECT  Physical Exam:    VS:  BP 134/72   Pulse 62   Ht 5\' 1"  (1.549 m)   Wt 139 lb 0.6 oz (63.1 kg)   SpO2 97%   BMI 26.27 kg/m     Wt Readings from Last 3 Encounters:  03/03/17 139 lb 0.6 oz (63.1 kg)  12/23/12 147 lb (66.7 kg)     GEN: Patient is in no acute distress HEENT: Normal NECK: No JVD; No carotid bruits LYMPHATICS: No lymphadenopathy CARDIAC: S1 S2 regular, 2/6 systolic murmur at the apex. RESPIRATORY:  Clear to auscultation without rales, wheezing or rhonchi  ABDOMEN: Soft, non-tender, non-distended MUSCULOSKELETAL:  No edema; No deformity  SKIN: Warm and dry NEUROLOGIC:  Alert and oriented x 3 PSYCHIATRIC:  Normal affect    Signed, Jenean Lindau, MD  03/03/2017 10:03 AM    [place]

## 2017-03-03 NOTE — Addendum Note (Signed)
Addended by: Warner Mccreedy E on: 03/03/2017 10:14 AM   Modules accepted: Orders

## 2017-03-03 NOTE — Patient Instructions (Signed)
Medication Instructions:  Your physician recommends that you continue on your current medications as directed. Please refer to the Current Medication list given to you today.   Labwork: None   Testing/Procedures: Your physician has requested that you have a lexiscan myoview. For further information please visit [email or web address]. Please follow instruction sheet, as given.   You had an EKG today   Follow-Up: Your physician wants you to follow-up in:6 months.   You will receive a reminder letter in the mail two months in advance. If you don't receive a letter, please call our office to schedule the follow-up appointment.   Any Other Special Instructions Will Be Listed Below (If Applicable).     If you need a refill on your cardiac medications before your next appointment, please call your pharmacy.

## 2017-03-04 ENCOUNTER — Telehealth (HOSPITAL_COMMUNITY): Payer: Self-pay | Admitting: *Deleted

## 2017-03-04 NOTE — Telephone Encounter (Signed)
Patient given detailed instructions per Myocardial Perfusion Study Information Sheet for the test on  03/08/17. Patient notified to arrive 15 minutes early and that it is imperative to arrive on time for appointment to keep from having the test rescheduled.  If you need to cancel or reschedule your appointment, please call the office within 24 hours of your appointment. . Patient verbalized understanding.[name] Jacqueline    

## 2017-03-08 ENCOUNTER — Encounter (HOSPITAL_COMMUNITY): Payer: Medicare Other

## 2017-03-08 ENCOUNTER — Telehealth: Payer: Self-pay

## 2017-03-08 NOTE — Telephone Encounter (Signed)
P/c with pt she states missed her tst this am due to stomach bug, she would like to be cleared for surgery 03/23/17 regardless, per RRR needs to have stress ,  pt refused to have tst stating I'm just not gonna have it an cx surgery as well.cn

## 2017-03-10 ENCOUNTER — Telehealth (HOSPITAL_COMMUNITY): Payer: Self-pay | Admitting: *Deleted

## 2017-03-10 NOTE — Telephone Encounter (Signed)
Attempted to call patient regarding upcoming appointment - busy x 2.   [name] Jacqueline  

## 2017-03-15 ENCOUNTER — Encounter (HOSPITAL_COMMUNITY): Payer: Medicare Other

## 2017-03-15 ENCOUNTER — Telehealth (HOSPITAL_COMMUNITY): Payer: Self-pay | Admitting: *Deleted

## 2017-03-15 NOTE — Telephone Encounter (Signed)
Left message on voicemail per DPR in reference to upcoming appointment scheduled on 03/16/17 at 10:00 with detailed instructions given per Myocardial Perfusion Study Information Sheet for the test. LM to arrive 15 minutes early, and that it is imperative to arrive on time for appointment to keep from having the test rescheduled. If you need to cancel or reschedule your appointment, please call the office within 24 hours of your appointment. Failure to do so may result in a cancellation of your appointment, and a $50 no show fee. Phone number given for call back for any questions.  [name]

## 2017-03-16 ENCOUNTER — Ambulatory Visit (HOSPITAL_COMMUNITY): Payer: Medicare Other | Attending: Cardiology

## 2017-03-16 DIAGNOSIS — I510 Cardiac septal defect, acquired: Secondary | ICD-10-CM | POA: Diagnosis not present

## 2017-03-16 DIAGNOSIS — I480 Paroxysmal atrial fibrillation: Secondary | ICD-10-CM

## 2017-03-16 DIAGNOSIS — R9439 Abnormal result of other cardiovascular function study: Secondary | ICD-10-CM | POA: Insufficient documentation

## 2017-03-16 DIAGNOSIS — Z01810 Encounter for preprocedural cardiovascular examination: Secondary | ICD-10-CM | POA: Insufficient documentation

## 2017-03-16 DIAGNOSIS — I5043 Acute on chronic combined systolic (congestive) and diastolic (congestive) heart failure: Secondary | ICD-10-CM | POA: Diagnosis not present

## 2017-03-16 MED ORDER — REGADENOSON 0.4 MG/5ML IV SOLN
0.4000 mg | Freq: Once | INTRAVENOUS | Status: AC
  Administered 2017-03-16: 0.4 mg via INTRAVENOUS

## 2017-03-16 MED ORDER — TECHNETIUM TC 99M TETROFOSMIN IV KIT
10.5000 | PACK | Freq: Once | INTRAVENOUS | Status: AC | PRN
  Administered 2017-03-16: 10.5 via INTRAVENOUS
  Filled 2017-03-16: qty 11

## 2017-03-16 MED ORDER — TECHNETIUM TC 99M TETROFOSMIN IV KIT
31.9000 | PACK | Freq: Once | INTRAVENOUS | Status: AC | PRN
  Administered 2017-03-16: 31.9 via INTRAVENOUS
  Filled 2017-03-16: qty 32

## 2017-03-17 LAB — MYOCARDIAL PERFUSION IMAGING
CHL CUP NUCLEAR SRS: 4
CHL CUP NUCLEAR SSS: 15
CSEPPHR: 94 {beats}/min
LV dias vol: 65 mL (ref 46–106)
LV sys vol: 16 mL
NUC STRESS TID: 0.99
RATE: 0.33
Rest HR: 69 {beats}/min
SDS: 11

## 2017-03-19 ENCOUNTER — Telehealth: Payer: Self-pay

## 2017-03-19 NOTE — Telephone Encounter (Signed)
-----   Message from Richardo Priest, MD sent at 03/19/2017 10:22 AM EDT ----- Regarding: FW: Cardiac Clearance  Looks good OK  to proceed  ----- Message ----- From: Orland Penman, CMA Sent: 03/19/2017   9:24 AM To: Richardo Priest, MD Subject: Cardiac Clearance                              This pt come to see RRR while you were out for cardiac clearance Rectocele that will be done by Dr. Louann Liv at [facility] on August 14th. Dr. Alfonso Patten sent pt to have exercise stress. These results are back could you please advise. Thanks!

## 2017-03-19 NOTE — Telephone Encounter (Signed)
Pt called requesting stress test results for cardiac Clearance. Pt was informed that results were back and once Dr. Bettina Gavia looked at them then we would know if she is cleared for surgery and I would send notes to Dr. Janalyn Shy office where he is getting the surgery. Pt verbalized understanding.    *Dr. Bettina Gavia has cleared pt and notes have been faxed*

## 2017-03-22 DIAGNOSIS — E871 Hypo-osmolality and hyponatremia: Secondary | ICD-10-CM | POA: Diagnosis not present

## 2017-03-22 DIAGNOSIS — I4891 Unspecified atrial fibrillation: Secondary | ICD-10-CM | POA: Diagnosis not present

## 2017-03-22 DIAGNOSIS — K219 Gastro-esophageal reflux disease without esophagitis: Secondary | ICD-10-CM | POA: Diagnosis not present

## 2017-03-22 DIAGNOSIS — Z79899 Other long term (current) drug therapy: Secondary | ICD-10-CM | POA: Diagnosis not present

## 2017-03-22 DIAGNOSIS — E039 Hypothyroidism, unspecified: Secondary | ICD-10-CM | POA: Diagnosis not present

## 2017-03-22 DIAGNOSIS — F419 Anxiety disorder, unspecified: Secondary | ICD-10-CM | POA: Diagnosis not present

## 2017-03-22 DIAGNOSIS — I10 Essential (primary) hypertension: Secondary | ICD-10-CM | POA: Diagnosis not present

## 2017-03-22 DIAGNOSIS — E785 Hyperlipidemia, unspecified: Secondary | ICD-10-CM | POA: Diagnosis not present

## 2017-03-23 DIAGNOSIS — N816 Rectocele: Secondary | ICD-10-CM | POA: Diagnosis not present

## 2017-03-23 DIAGNOSIS — I10 Essential (primary) hypertension: Secondary | ICD-10-CM | POA: Diagnosis not present

## 2017-03-23 DIAGNOSIS — I4891 Unspecified atrial fibrillation: Secondary | ICD-10-CM | POA: Diagnosis not present

## 2017-03-23 DIAGNOSIS — E079 Disorder of thyroid, unspecified: Secondary | ICD-10-CM | POA: Diagnosis not present

## 2017-03-23 DIAGNOSIS — Z79891 Long term (current) use of opiate analgesic: Secondary | ICD-10-CM | POA: Diagnosis not present

## 2017-03-23 DIAGNOSIS — N761 Subacute and chronic vaginitis: Secondary | ICD-10-CM | POA: Diagnosis not present

## 2017-03-23 HISTORY — PX: RECTOCELE REPAIR: SHX761

## 2017-03-24 DIAGNOSIS — Z79891 Long term (current) use of opiate analgesic: Secondary | ICD-10-CM | POA: Diagnosis not present

## 2017-03-24 DIAGNOSIS — N816 Rectocele: Secondary | ICD-10-CM | POA: Diagnosis not present

## 2017-03-24 DIAGNOSIS — E079 Disorder of thyroid, unspecified: Secondary | ICD-10-CM | POA: Diagnosis not present

## 2017-03-24 DIAGNOSIS — I10 Essential (primary) hypertension: Secondary | ICD-10-CM | POA: Diagnosis not present

## 2017-03-24 DIAGNOSIS — I4891 Unspecified atrial fibrillation: Secondary | ICD-10-CM | POA: Diagnosis not present

## 2017-04-14 DIAGNOSIS — H40003 Preglaucoma, unspecified, bilateral: Secondary | ICD-10-CM | POA: Diagnosis not present

## 2017-04-15 DIAGNOSIS — Z6826 Body mass index (BMI) 26.0-26.9, adult: Secondary | ICD-10-CM | POA: Diagnosis not present

## 2017-04-15 DIAGNOSIS — K219 Gastro-esophageal reflux disease without esophagitis: Secondary | ICD-10-CM | POA: Diagnosis not present

## 2017-04-15 DIAGNOSIS — I4891 Unspecified atrial fibrillation: Secondary | ICD-10-CM | POA: Diagnosis not present

## 2017-05-05 DIAGNOSIS — N898 Other specified noninflammatory disorders of vagina: Secondary | ICD-10-CM | POA: Diagnosis not present

## 2017-05-11 DIAGNOSIS — N318 Other neuromuscular dysfunction of bladder: Secondary | ICD-10-CM | POA: Diagnosis not present

## 2017-05-11 DIAGNOSIS — R3121 Asymptomatic microscopic hematuria: Secondary | ICD-10-CM | POA: Diagnosis not present

## 2017-05-11 DIAGNOSIS — N3090 Cystitis, unspecified without hematuria: Secondary | ICD-10-CM | POA: Diagnosis not present

## 2017-05-17 DIAGNOSIS — K219 Gastro-esophageal reflux disease without esophagitis: Secondary | ICD-10-CM | POA: Diagnosis not present

## 2017-05-17 DIAGNOSIS — I4891 Unspecified atrial fibrillation: Secondary | ICD-10-CM | POA: Diagnosis not present

## 2017-05-17 DIAGNOSIS — F419 Anxiety disorder, unspecified: Secondary | ICD-10-CM | POA: Diagnosis not present

## 2017-05-17 DIAGNOSIS — H9201 Otalgia, right ear: Secondary | ICD-10-CM | POA: Diagnosis not present

## 2017-05-17 DIAGNOSIS — Z6826 Body mass index (BMI) 26.0-26.9, adult: Secondary | ICD-10-CM | POA: Diagnosis not present

## 2017-05-17 DIAGNOSIS — H6121 Impacted cerumen, right ear: Secondary | ICD-10-CM | POA: Diagnosis not present

## 2017-06-07 DIAGNOSIS — Z6827 Body mass index (BMI) 27.0-27.9, adult: Secondary | ICD-10-CM | POA: Diagnosis not present

## 2017-06-07 DIAGNOSIS — J019 Acute sinusitis, unspecified: Secondary | ICD-10-CM | POA: Diagnosis not present

## 2017-06-26 DIAGNOSIS — F419 Anxiety disorder, unspecified: Secondary | ICD-10-CM | POA: Diagnosis not present

## 2017-06-26 DIAGNOSIS — K219 Gastro-esophageal reflux disease without esophagitis: Secondary | ICD-10-CM | POA: Diagnosis not present

## 2017-06-26 DIAGNOSIS — E039 Hypothyroidism, unspecified: Secondary | ICD-10-CM | POA: Diagnosis not present

## 2017-06-26 DIAGNOSIS — E871 Hypo-osmolality and hyponatremia: Secondary | ICD-10-CM | POA: Diagnosis not present

## 2017-06-26 DIAGNOSIS — I4891 Unspecified atrial fibrillation: Secondary | ICD-10-CM | POA: Diagnosis not present

## 2017-06-26 DIAGNOSIS — I10 Essential (primary) hypertension: Secondary | ICD-10-CM | POA: Diagnosis not present

## 2017-06-26 DIAGNOSIS — E785 Hyperlipidemia, unspecified: Secondary | ICD-10-CM | POA: Diagnosis not present

## 2017-06-26 DIAGNOSIS — Z79899 Other long term (current) drug therapy: Secondary | ICD-10-CM | POA: Diagnosis not present

## 2017-08-11 DIAGNOSIS — I4891 Unspecified atrial fibrillation: Secondary | ICD-10-CM | POA: Diagnosis not present

## 2017-08-11 DIAGNOSIS — E871 Hypo-osmolality and hyponatremia: Secondary | ICD-10-CM | POA: Diagnosis not present

## 2017-08-11 DIAGNOSIS — E039 Hypothyroidism, unspecified: Secondary | ICD-10-CM | POA: Diagnosis not present

## 2017-08-11 DIAGNOSIS — R531 Weakness: Secondary | ICD-10-CM | POA: Diagnosis not present

## 2017-08-17 DIAGNOSIS — N318 Other neuromuscular dysfunction of bladder: Secondary | ICD-10-CM | POA: Diagnosis not present

## 2017-08-17 DIAGNOSIS — N816 Rectocele: Secondary | ICD-10-CM | POA: Diagnosis not present

## 2017-08-17 DIAGNOSIS — N3020 Other chronic cystitis without hematuria: Secondary | ICD-10-CM | POA: Diagnosis not present

## 2017-08-31 DIAGNOSIS — Z0000 Encounter for general adult medical examination without abnormal findings: Secondary | ICD-10-CM | POA: Diagnosis not present

## 2017-08-31 DIAGNOSIS — Z1331 Encounter for screening for depression: Secondary | ICD-10-CM | POA: Diagnosis not present

## 2017-08-31 DIAGNOSIS — Z136 Encounter for screening for cardiovascular disorders: Secondary | ICD-10-CM | POA: Diagnosis not present

## 2017-08-31 DIAGNOSIS — E785 Hyperlipidemia, unspecified: Secondary | ICD-10-CM | POA: Diagnosis not present

## 2017-08-31 DIAGNOSIS — Z1231 Encounter for screening mammogram for malignant neoplasm of breast: Secondary | ICD-10-CM | POA: Diagnosis not present

## 2017-08-31 DIAGNOSIS — Z9181 History of falling: Secondary | ICD-10-CM | POA: Diagnosis not present

## 2017-09-20 ENCOUNTER — Telehealth: Payer: Self-pay | Admitting: Cardiology

## 2017-09-20 ENCOUNTER — Other Ambulatory Visit: Payer: Self-pay

## 2017-09-20 MED ORDER — DILTIAZEM HCL ER COATED BEADS 180 MG PO CP24: 180.0000 mg | ORAL_CAPSULE | Freq: Two times a day (BID) | ORAL | 1 refills | Status: DC

## 2017-09-20 NOTE — Telephone Encounter (Signed)
Med refill sent

## 2017-09-20 NOTE — Telephone Encounter (Signed)
Patient has appt end of month but needs her diltiazem 180mg  refilled please

## 2017-09-21 DIAGNOSIS — R351 Nocturia: Secondary | ICD-10-CM | POA: Diagnosis not present

## 2017-09-21 DIAGNOSIS — N3020 Other chronic cystitis without hematuria: Secondary | ICD-10-CM | POA: Diagnosis not present

## 2017-09-30 DIAGNOSIS — M1990 Unspecified osteoarthritis, unspecified site: Secondary | ICD-10-CM | POA: Diagnosis not present

## 2017-09-30 DIAGNOSIS — R918 Other nonspecific abnormal finding of lung field: Secondary | ICD-10-CM | POA: Diagnosis not present

## 2017-09-30 DIAGNOSIS — J181 Lobar pneumonia, unspecified organism: Secondary | ICD-10-CM | POA: Diagnosis not present

## 2017-09-30 DIAGNOSIS — I252 Old myocardial infarction: Secondary | ICD-10-CM | POA: Diagnosis not present

## 2017-09-30 DIAGNOSIS — I4891 Unspecified atrial fibrillation: Secondary | ICD-10-CM | POA: Diagnosis not present

## 2017-09-30 DIAGNOSIS — I110 Hypertensive heart disease with heart failure: Secondary | ICD-10-CM | POA: Diagnosis not present

## 2017-09-30 DIAGNOSIS — Z87891 Personal history of nicotine dependence: Secondary | ICD-10-CM | POA: Diagnosis not present

## 2017-09-30 DIAGNOSIS — I509 Heart failure, unspecified: Secondary | ICD-10-CM | POA: Diagnosis not present

## 2017-09-30 DIAGNOSIS — K219 Gastro-esophageal reflux disease without esophagitis: Secondary | ICD-10-CM | POA: Diagnosis not present

## 2017-10-02 DIAGNOSIS — J029 Acute pharyngitis, unspecified: Secondary | ICD-10-CM | POA: Diagnosis not present

## 2017-10-02 DIAGNOSIS — J189 Pneumonia, unspecified organism: Secondary | ICD-10-CM | POA: Diagnosis not present

## 2017-10-05 ENCOUNTER — Ambulatory Visit: Payer: Medicare Other | Admitting: Cardiology

## 2017-10-12 DIAGNOSIS — H40003 Preglaucoma, unspecified, bilateral: Secondary | ICD-10-CM | POA: Diagnosis not present

## 2017-10-13 NOTE — Progress Notes (Signed)
Cardiology Office Note:    Date:  10/14/2017   ID:  [name], DOB [birth year], MRN [id number]  PCP:  Nicoletta Dress, MD  Cardiologist:  Shirlee More, MD    Referring MD: Nicoletta Dress, MD    ASSESSMENT:    1. Chronic diastolic congestive heart failure (HCC)   2. Paroxysmal atrial fibrillation ([place])   3. PAT (paroxysmal atrial tachycardia) ([place])   4. Chronic anticoagulation    PLAN:    In order of problems listed above:  1. Stable compensated does not require loop diuretic at this time 2. Stable no recurrence continue calcium channel blocker anticoagulant 3. Improved since she started taking calcium channel blocker she has had no clinical breakthrough episodes continue same 4. Tolerates her anticoagulant without bleeding continue the current dose   Next appointment: 6 months   Medication Adjustments/Labs and Tests Ordered: Current medicines are reviewed at length with the patient today.  Concerns regarding medicines are outlined above.  No orders of the defined types were placed in this encounter.  No orders of the defined types were placed in this encounter.   Chief Complaint  Patient presents with  . Follow-up    6 month flup appt   . Atrial Fibrillation  . Congestive Heart Failure  . Anticoagulation    History of Present Illness:    [name] is a 77 y.o. female with a hx of mild CAD CHF, Atrial Fibrillation with multiple EP interventional procedures, SVT and atrial flutter  last seen 11/11/16. Compliance with diet, lifestyle and medications: Yes  She is improved and pleased with the quality of her life since rectocele repair since she started taking calcium channel blocker she has had no breakthrough arrhythmia palpitations syncope TIA chest pain or shortness of breath.  Her only clinical complaint is constipation that is improving with treatment Past Medical History:  Diagnosis Date  .  Possible Neuropathy 04/16/2015  . Adverse effect of drug  04/16/2015  . Anesthesia to pain 03/02/2017   Overview:  reaction to Protamine with 2nd cardiac ablation.  . Anticoagulant long-term use    ELIQUIS  . Anticoagulated 06/20/2016  . Anxiety 12/05/2015  . Atrial fibrillation ([place]) 01/10/2014  . Atrial flutter by electrocardiogram ([place]) 06/06/2014  . Atrial flutter, paroxysmal ([place])   . Atypical atrial flutter ([place]) 10/21/2016  . Chronic anticoagulation 02/13/2015  . Congenital anomaly of pulmonary vein    per Cardiac MRI done at [facility] 02-16-2014-- noted 5 pulmonary veins (3 on the right and 2 on the left ) to left atrium  . Congestive heart failure ([place]) 01/10/2014  . Current use of long term anticoagulation 02/13/2015  . Drug reaction to Amoxicillin 04/16/2015  . Eczema 04/16/2015  . Encounter for monitoring sotalol therapy 06/06/2014  . Encounter for therapeutic drug level monitoring 06/06/2014  . Esophageal reflux 01/10/2014  . First degree heart block   . Gastroesophageal reflux disease 01/10/2014  . GERD (gastroesophageal reflux disease)   . High risk medication use 02/13/2015   Overview:  Ticosyn  . History of amiodarone therapy 06/20/2016  . History of diverticulitis 06/25/2016  . History of diverticulitis of colon    04/ 2017  . Hyperlipidemia   . Hypertension   . Hyponatremia 05/17/2016  . Hypothyroid 12/05/2015  . Hypothyroidism   . Hypothyroidism (acquired) 01/10/2014  . Long term current use of antiarrhythmic drug 10/15/2016  . On amiodarone therapy 03/23/2016  . Ovarian cyst 12/05/2015  . PAF (paroxysmal atrial fibrillation) [facility]) cardiologist- dr Bettina Gavia (  [place])  . Paroxysmal atrial fibrillation ([place]) 02/13/2015   Overview:  had pulmonary vein ablation for AF in 2010 and redo for recurrence in April 2012  * CHADS2 score=2  Overview:  had pulmonary vein ablation for AF in 2010 and redo for recurrence in April 2012  * CHADS2 score=2  . PAT (paroxysmal atrial tachycardia) ([place])   . Rectocele 12/05/2015  . S/P ablation of atrial fibrillation 2011   &  2012  &  02-19-2014 at [place] cardiologist-- dr Norm Salt ([place])  . Systolic CHF ([place])   . Urethral stenosis   . Urethral stricture   . Ventral hernia 05/17/2016  . Wears glasses     Past Surgical History:  Procedure Laterality Date  . ABDOMINAL HERNIA REPAIR  10/ 2017   at [facility]  and 1989  . ABDOMINAL HYSTERECTOMY  1986  . ANTERIOR REPAIR AND TRANSOBTURATOR SLING  09-08-2010   CYSTOCELE AND SUI  . CARDIAC CATHETERIZATION  1991 ([facility]) &  2006 (dr Lia Foyer)   lvf preserved/ 45-50% mid rca/  40% lad after takeoff diagonal branch  . CARDIAC ELECTROPHYSIOLOGY STUDY AND ABLATION  x3 2011  &  2012  ([facility]) and 02-19-2014 at [place]   per EP study report 02-19-2014-- re-do PVI (LIPV & RSPV re-isolated) left atrial roof line & CTI line performed  . CARDIOVASCULAR STRESS TEST  04-08-2009     normal study/ ef 65%  . CARDIOVERSION  last one 09-01-2016  in [place]  . CYSTOSCOPY WITH URETHRAL DILATATION N/A 12/23/2012   Procedure: CYSTOSCOPY WITH BALLOON DILATATION;  Surgeon: Claybon Jabs, MD;  Location[facility];  Service: Urology;  Laterality: N/A;  . TRANSESOPHAGEAL ECHOCARDIOGRAM  05-20-2009   normal lvf and structure/ ef 60-65%/ no thrombus  . TUBAL LIGATION  age 83    Current Medications: Current Meds  Medication Sig  . acetaminophen (TYLENOL) 500 MG tablet Take by mouth every 6 (six) hours as needed.   . ALPRAZolam (XANAX) 0.25 MG tablet Take 0.25 mg by mouth 2 (two) times daily as needed.   . dicyclomine (BENTYL) 10 MG capsule Take 10 mg by mouth as needed.   . diltiazem (CARDIZEM CD) 180 MG 24 hr capsule Take 1 capsule (180 mg total) by mouth 2 (two) times daily. Takes at [place]  . ELIQUIS 5 MG TABS tablet Take 5 mg by mouth 2 (two) times daily. Pt aware to stop 3 days prior to [facility] 10-16-2016  . levothyroxine (SYNTHROID, LEVOTHROID) 100 MCG tablet Take 100 mcg by mouth daily before breakfast.  . loratadine (CLARITIN) 10 MG tablet  Take 10 mg by mouth as needed for allergies.  Marland Kitchen losartan (COZAAR) 100 MG tablet TK 1 T PO HS  . magnesium hydroxide (MILK OF MAGNESIA) 400 MG/5ML suspension Take by mouth daily as needed for mild constipation.  . pantoprazole (PROTONIX) 40 MG tablet TK 1 T PO D-- takes in afternoon  . simvastatin (ZOCOR) 40 MG tablet TK 1 T PO HS     Allergies:   Amiodarone; Protamine; Amoxicillin; Celebrex [celecoxib]; Clindamycin; Lisinopril; Ace inhibitors; Cleocin [clindamycin hcl]; and Diflucan [fluconazole]   Social History   Socioeconomic History  . Marital status: Widowed    Spouse name: None  . Number of children: None  . Years of education: None  . Highest education level: None  Social Needs  . Financial resource strain: None  . Food insecurity - worry: None  . Food insecurity - inability: None  .  Transportation needs - medical: None  . Transportation needs - non-medical: None  Occupational History  . None  Tobacco Use  . Smoking status: Former Smoker    Packs/day: 0.75    Years: 30.00    Pack years: 22.50    Types: Cigarettes    Last attempt to quit: 12/21/2004    Years since quitting: 12.8  . Smokeless tobacco: Never Used  Substance and Sexual Activity  . Alcohol use: No  . Drug use: No  . Sexual activity: None  Other Topics Concern  . None  Social History Narrative  . None     Family History: The patient's family history includes Heart attack in her father and sister; Liver cancer in her maternal grandfather and maternal grandmother; Stroke in her mother and paternal grandmother. ROS:   Please see the history of present illness.    All other systems reviewed and are negative.  EKGs/Labs/Other Studies Reviewed:    The following studies were reviewed today:   MPI 03/16/17: Small size, moderate intensity fixed anteroapical attenuation artifact. No significant reversible ischemia. LVEF 75% with normal wall motion. This is a low risk study.  Recent Labs: No results  found for requested labs within last 8760 hours.  Recent Lipid Panel No results found for: CHOL, TRIG, HDL, CHOLHDL, VLDL, LDLCALC, LDLDIRECT  Physical Exam:    VS:  BP (!) 142/72 (BP Location: Right Arm, Patient Position: Sitting, Cuff Size: Normal)   Pulse 68   Ht 5\' 1"  (1.549 m)   Wt 141 lb (64 kg)   SpO2 98%   BMI 26.64 kg/m     Wt Readings from Last 3 Encounters:  10/14/17 141 lb (64 kg)  03/16/17 139 lb (63 kg)  03/03/17 139 lb 0.6 oz (63.1 kg)     GEN:  Well nourished, well developed in no acute distress HEENT: Normal NECK: No JVD; No carotid bruits LYMPHATICS: No lymphadenopathy CARDIAC: RRR, no murmurs, rubs, gallops RESPIRATORY:  Clear to auscultation without rales, wheezing or rhonchi  ABDOMEN: Soft, non-tender, non-distended MUSCULOSKELETAL:  No edema; No deformity  SKIN: Warm and dry NEUROLOGIC:  Alert and oriented x 3 PSYCHIATRIC:  Normal affect    Signed, Shirlee More, MD  10/14/2017 10:45 AM    [place]

## 2017-10-14 ENCOUNTER — Ambulatory Visit (INDEPENDENT_AMBULATORY_CARE_PROVIDER_SITE_OTHER): Payer: Medicare Other | Admitting: Cardiology

## 2017-10-14 ENCOUNTER — Encounter: Payer: Self-pay | Admitting: *Deleted

## 2017-10-14 ENCOUNTER — Encounter: Payer: Self-pay | Admitting: Cardiology

## 2017-10-14 VITALS — BP 142/72 | HR 68 | Ht 61.0 in | Wt 141.0 lb

## 2017-10-14 DIAGNOSIS — I5032 Chronic diastolic (congestive) heart failure: Secondary | ICD-10-CM

## 2017-10-14 DIAGNOSIS — I471 Supraventricular tachycardia: Secondary | ICD-10-CM

## 2017-10-14 DIAGNOSIS — I480 Paroxysmal atrial fibrillation: Secondary | ICD-10-CM | POA: Diagnosis not present

## 2017-10-14 DIAGNOSIS — Z7901 Long term (current) use of anticoagulants: Secondary | ICD-10-CM | POA: Diagnosis not present

## 2017-10-14 NOTE — Patient Instructions (Addendum)
Medication Instructions:  Your physician recommends that you continue on your current medications as directed. Please refer to the Current Medication list given to you today.   Labwork: None  Testing/Procedures: None  Follow-Up: Your physician wants you to follow-up in: 6 months. You will receive a reminder letter in the mail two months in advance. If you don't receive a letter, please call our office to schedule the follow-up appointment.   Any Other Special Instructions Will Be Listed Below (If Applicable).     If you need a refill on your cardiac medications before your next appointment, please call your pharmacy.     1. Avoid all over-the-counter antihistamines except Claritin/Loratadine and Zyrtec/Cetrizine. 2. Avoid all combination including cold sinus allergies flu decongestant and sleep medications 3. You can use Robitussin DM Mucinex and Mucinex DM for cough. 4. can use Tylenol aspirin ibuprofen and naproxen but no combinations such as sleep or sinus.

## 2017-11-05 DIAGNOSIS — Z79899 Other long term (current) drug therapy: Secondary | ICD-10-CM | POA: Diagnosis not present

## 2017-11-05 DIAGNOSIS — E871 Hypo-osmolality and hyponatremia: Secondary | ICD-10-CM | POA: Diagnosis not present

## 2017-11-05 DIAGNOSIS — E785 Hyperlipidemia, unspecified: Secondary | ICD-10-CM | POA: Diagnosis not present

## 2017-11-05 DIAGNOSIS — E039 Hypothyroidism, unspecified: Secondary | ICD-10-CM | POA: Diagnosis not present

## 2017-11-11 DIAGNOSIS — Z8701 Personal history of pneumonia (recurrent): Secondary | ICD-10-CM | POA: Diagnosis not present

## 2017-11-11 DIAGNOSIS — R918 Other nonspecific abnormal finding of lung field: Secondary | ICD-10-CM | POA: Diagnosis not present

## 2017-11-11 DIAGNOSIS — I517 Cardiomegaly: Secondary | ICD-10-CM | POA: Diagnosis not present

## 2017-11-11 DIAGNOSIS — J189 Pneumonia, unspecified organism: Secondary | ICD-10-CM | POA: Diagnosis not present

## 2017-11-15 DIAGNOSIS — Z1231 Encounter for screening mammogram for malignant neoplasm of breast: Secondary | ICD-10-CM | POA: Diagnosis not present

## 2017-12-01 DIAGNOSIS — R300 Dysuria: Secondary | ICD-10-CM | POA: Diagnosis not present

## 2017-12-01 DIAGNOSIS — F419 Anxiety disorder, unspecified: Secondary | ICD-10-CM | POA: Diagnosis not present

## 2017-12-28 DIAGNOSIS — M898X1 Other specified disorders of bone, shoulder: Secondary | ICD-10-CM | POA: Diagnosis not present

## 2017-12-28 DIAGNOSIS — M25512 Pain in left shoulder: Secondary | ICD-10-CM | POA: Diagnosis not present

## 2017-12-28 DIAGNOSIS — R0789 Other chest pain: Secondary | ICD-10-CM | POA: Diagnosis not present

## 2017-12-28 DIAGNOSIS — S299XXA Unspecified injury of thorax, initial encounter: Secondary | ICD-10-CM | POA: Diagnosis not present

## 2017-12-28 DIAGNOSIS — S4992XA Unspecified injury of left shoulder and upper arm, initial encounter: Secondary | ICD-10-CM | POA: Diagnosis not present

## 2017-12-28 DIAGNOSIS — R072 Precordial pain: Secondary | ICD-10-CM | POA: Diagnosis not present

## 2018-01-04 DIAGNOSIS — B373 Candidiasis of vulva and vagina: Secondary | ICD-10-CM | POA: Diagnosis not present

## 2018-01-04 DIAGNOSIS — R102 Pelvic and perineal pain: Secondary | ICD-10-CM | POA: Diagnosis not present

## 2018-01-11 DIAGNOSIS — M1990 Unspecified osteoarthritis, unspecified site: Secondary | ICD-10-CM | POA: Diagnosis not present

## 2018-01-11 DIAGNOSIS — J019 Acute sinusitis, unspecified: Secondary | ICD-10-CM | POA: Diagnosis not present

## 2018-01-11 DIAGNOSIS — E039 Hypothyroidism, unspecified: Secondary | ICD-10-CM | POA: Diagnosis not present

## 2018-01-11 DIAGNOSIS — I4891 Unspecified atrial fibrillation: Secondary | ICD-10-CM | POA: Diagnosis not present

## 2018-02-01 DIAGNOSIS — N3000 Acute cystitis without hematuria: Secondary | ICD-10-CM | POA: Diagnosis not present

## 2018-02-01 DIAGNOSIS — R351 Nocturia: Secondary | ICD-10-CM | POA: Diagnosis not present

## 2018-02-04 DIAGNOSIS — R351 Nocturia: Secondary | ICD-10-CM | POA: Diagnosis not present

## 2018-02-04 DIAGNOSIS — N3592 Unspecified urethral stricture, female: Secondary | ICD-10-CM | POA: Diagnosis not present

## 2018-02-08 DIAGNOSIS — L821 Other seborrheic keratosis: Secondary | ICD-10-CM | POA: Diagnosis not present

## 2018-02-08 DIAGNOSIS — L578 Other skin changes due to chronic exposure to nonionizing radiation: Secondary | ICD-10-CM | POA: Diagnosis not present

## 2018-02-08 DIAGNOSIS — D1801 Hemangioma of skin and subcutaneous tissue: Secondary | ICD-10-CM | POA: Diagnosis not present

## 2018-02-08 DIAGNOSIS — L570 Actinic keratosis: Secondary | ICD-10-CM | POA: Diagnosis not present

## 2018-02-11 DIAGNOSIS — E039 Hypothyroidism, unspecified: Secondary | ICD-10-CM | POA: Diagnosis not present

## 2018-02-11 DIAGNOSIS — K219 Gastro-esophageal reflux disease without esophagitis: Secondary | ICD-10-CM | POA: Diagnosis not present

## 2018-02-11 DIAGNOSIS — Z79899 Other long term (current) drug therapy: Secondary | ICD-10-CM | POA: Diagnosis not present

## 2018-02-11 DIAGNOSIS — E785 Hyperlipidemia, unspecified: Secondary | ICD-10-CM | POA: Diagnosis not present

## 2018-02-11 DIAGNOSIS — I10 Essential (primary) hypertension: Secondary | ICD-10-CM | POA: Diagnosis not present

## 2018-02-15 DIAGNOSIS — H40003 Preglaucoma, unspecified, bilateral: Secondary | ICD-10-CM | POA: Diagnosis not present

## 2018-02-22 DIAGNOSIS — R351 Nocturia: Secondary | ICD-10-CM | POA: Diagnosis not present

## 2018-02-22 DIAGNOSIS — N3020 Other chronic cystitis without hematuria: Secondary | ICD-10-CM | POA: Diagnosis not present

## 2018-02-28 DIAGNOSIS — K5900 Constipation, unspecified: Secondary | ICD-10-CM | POA: Diagnosis not present

## 2018-02-28 DIAGNOSIS — E871 Hypo-osmolality and hyponatremia: Secondary | ICD-10-CM | POA: Diagnosis not present

## 2018-02-28 DIAGNOSIS — K219 Gastro-esophageal reflux disease without esophagitis: Secondary | ICD-10-CM | POA: Diagnosis not present

## 2018-03-09 DIAGNOSIS — K5900 Constipation, unspecified: Secondary | ICD-10-CM | POA: Diagnosis not present

## 2018-03-21 DIAGNOSIS — Z6826 Body mass index (BMI) 26.0-26.9, adult: Secondary | ICD-10-CM | POA: Diagnosis not present

## 2018-03-21 DIAGNOSIS — J019 Acute sinusitis, unspecified: Secondary | ICD-10-CM | POA: Diagnosis not present

## 2018-03-24 ENCOUNTER — Other Ambulatory Visit: Payer: Self-pay | Admitting: Cardiology

## 2018-03-24 MED ORDER — DILTIAZEM HCL ER COATED BEADS 180 MG PO CP24: 180.0000 mg | ORAL_CAPSULE | Freq: Two times a day (BID) | ORAL | 1 refills | Status: DC

## 2018-03-24 NOTE — Telephone Encounter (Signed)
Medication refilled

## 2018-03-24 NOTE — Telephone Encounter (Signed)
°*  STAT* If patient is at the pharmacy, call can be transferred to refill team.   1. Which medications need to be refilled? (please list name of each medication and dose if known) Ditalizem 180 mg takes 2 daily  2. Which pharmacy/location (including street and city if local pharmacy) is medication to be sent to?[facility]  3. Do they need a 30 day or 90 day supply? [place]

## 2018-03-29 DIAGNOSIS — K589 Irritable bowel syndrome without diarrhea: Secondary | ICD-10-CM | POA: Diagnosis not present

## 2018-03-29 DIAGNOSIS — R1011 Right upper quadrant pain: Secondary | ICD-10-CM | POA: Diagnosis not present

## 2018-04-08 DIAGNOSIS — N3020 Other chronic cystitis without hematuria: Secondary | ICD-10-CM | POA: Diagnosis not present

## 2018-04-08 DIAGNOSIS — N3281 Overactive bladder: Secondary | ICD-10-CM | POA: Diagnosis not present

## 2018-04-09 DIAGNOSIS — R1011 Right upper quadrant pain: Secondary | ICD-10-CM | POA: Diagnosis not present

## 2018-04-09 DIAGNOSIS — R110 Nausea: Secondary | ICD-10-CM | POA: Diagnosis not present

## 2018-04-20 NOTE — Progress Notes (Signed)
Cardiology Office Note:    Date:  04/21/2018   ID:  [name], DOB 09-Aug-1941, MRN [id number]  PCP:  Nicoletta Dress, MD  Cardiologist:  Shirlee More, MD    Referring MD: Nicoletta Dress, MD    ASSESSMENT:    1. Paroxysmal atrial fibrillation (HCC)   2. Carotid atherosclerosis, unspecified laterality   3. Essential hypertension    PLAN:    In order of problems listed above:  1. She is doing very well has had no clinical recurrence continue current medical treatment including her anticoagulant.  Present time she does not require an antiarrhythmic drug 2. Asymptomatic but she reminds me that she has bilateral carotid atherosclerosis small follow-up cerebrovascular duplex if flow-limiting grade 80% stenosis would need evaluation by vascular surgery. 3. Stable blood pressure target continue current treatment including calcium channel blocker ARB 4. Hyperlipidemia stable continue her statin   Next appointment: 6 months   Medication Adjustments/Labs and Tests Ordered: Current medicines are reviewed at length with the patient today.  Concerns regarding medicines are outlined above.  Orders Placed This Encounter  Procedures  . EKG 12-Lead   No orders of the defined types were placed in this encounter.   Chief Complaint  Patient presents with  . Atrial Fibrillation  . Congestive Heart Failure    History of Present Illness:    [name] is a 77 y.o. female with a hx of mild CAD CHF, Atrial Fibrillation with multiple EP interventional procedures, SVT and atrial flutter  last seen 10/14/17. Compliance with diet, lifestyle and medications: Yes  She is doing better has had no recurrence of her atrial arrhythmia.  No palpitations syncope TIA or bleeding complication of her anticoagulant.  No shortness of breath or chest pain.  Her primary problem is severe chronic constipation Past Medical History:  Diagnosis Date  .  Possible Neuropathy 04/16/2015  . Adverse effect  of drug 04/16/2015  . Anesthesia to pain 03/02/2017   Overview:  reaction to Protamine with 2nd cardiac ablation.  . Anticoagulant long-term use    ELIQUIS  . Anticoagulated 06/20/2016  . Anxiety 12/05/2015  . Atrial fibrillation ([place]) 01/10/2014  . Atrial flutter by electrocardiogram ([place]) 06/06/2014  . Atrial flutter, paroxysmal ([place])   . Atypical atrial flutter ([place]) 10/21/2016  . Chronic anticoagulation 02/13/2015  . Congenital anomaly of pulmonary vein    per Cardiac [facility] done at [facility] 02-16-2014-- noted 5 pulmonary veins (3 on the right and 2 on the left ) to left atrium  . Congestive heart failure ([place]) 01/10/2014  . Current use of long term anticoagulation 02/13/2015  . Drug reaction to Amoxicillin 04/16/2015  . Eczema 04/16/2015  . Encounter for monitoring sotalol therapy 06/06/2014  . Encounter for therapeutic drug level monitoring 06/06/2014  . Esophageal reflux 01/10/2014  . First degree heart block   . Gastroesophageal reflux disease 01/10/2014  . GERD (gastroesophageal reflux disease)   . High risk medication use 02/13/2015   Overview:  Ticosyn  . History of amiodarone therapy 06/20/2016  . History of diverticulitis 06/25/2016  . History of diverticulitis of colon    04/ 2017  . Hyperlipidemia   . Hypertension   . Hyponatremia 05/17/2016  . Hypothyroid 12/05/2015  . Hypothyroidism   . Hypothyroidism (acquired) 01/10/2014  . Long term current use of antiarrhythmic drug 10/15/2016  . On amiodarone therapy 03/23/2016  . Ovarian cyst 12/05/2015  . PAF (paroxysmal atrial fibrillation) [facility]) cardiologist- dr Bettina Gavia [place] [place])  . Paroxysmal atrial fibrillation (HCC)  02/13/2015   Overview:  had pulmonary vein ablation for AF in 2010 and redo for recurrence in April 2012  * CHADS2 score=2  Overview:  had pulmonary vein ablation for AF in 2010 and redo for recurrence in April 2012  * CHADS2 score=2  . PAT (paroxysmal atrial tachycardia) ([place])   . Rectocele 12/05/2015  . S/P ablation of atrial  fibrillation 2011  &  2012  &  02-19-2014 at [place] cardiologist-- dr Norm Salt ([place])  . Systolic CHF ([place])   . Urethral stenosis   . Urethral stricture   . Ventral hernia 05/17/2016  . Wears glasses     Past Surgical History:  Procedure Laterality Date  . ABDOMINAL HERNIA REPAIR  10/ 2017   at [facility]  and 1989  . ABDOMINAL HYSTERECTOMY  1986  . ANTERIOR REPAIR AND TRANSOBTURATOR SLING  09-08-2010   CYSTOCELE AND SUI  . CARDIAC CATHETERIZATION  1991 ([facility]) &  2006 (dr Lia Foyer)   lvf preserved/ 45-50% mid rca/  40% lad after takeoff diagonal branch  . CARDIAC ELECTROPHYSIOLOGY STUDY AND ABLATION  x3 2011  &  2012  ([facility]) and 02-19-2014 at [place]   per EP study report 02-19-2014-- re-do PVI (LIPV & RSPV re-isolated) left atrial roof line & CTI line performed  . CARDIOVASCULAR STRESS TEST  04-08-2009     normal study/ ef 65%  . CARDIOVERSION  last one 09-01-2016  in [place]  . CYSTOSCOPY WITH URETHRAL DILATATION N/A 12/23/2012   Procedure: CYSTOSCOPY WITH BALLOON DILATATION;  Surgeon: Claybon Jabs, MD;  Location: [facility];  Service: Urology;  Laterality: N/A;  . TRANSESOPHAGEAL ECHOCARDIOGRAM  05-20-2009   normal lvf and structure/ ef 60-65%/ no thrombus  . TUBAL LIGATION  age 70    Current Medications: Current Meds  Medication Sig  . acetaminophen (TYLENOL) 500 MG tablet Take by mouth every 6 (six) hours as needed.   . ALPRAZolam (XANAX) 0.25 MG tablet Take 0.25 mg by mouth 2 (two) times daily as needed.   . dicyclomine (BENTYL) 10 MG capsule Take 10 mg by mouth as needed.   . diltiazem (CARDIZEM CD) 180 MG 24 hr capsule Take 1 capsule (180 mg total) by mouth 2 (two) times daily. Takes at [place]  . ELIQUIS 5 MG TABS tablet Take 5 mg by mouth 2 (two) times daily. Pt aware to stop 3 days prior to [facility] 10-16-2016  . levothyroxine (SYNTHROID, LEVOTHROID) 100 MCG tablet Take 100 mcg by mouth daily before breakfast.  . loratadine  (CLARITIN) 10 MG tablet Take 10 mg by mouth as needed for allergies.  Marland Kitchen losartan (COZAAR) 100 MG tablet TK 1 T PO HS  . magnesium hydroxide (MILK OF MAGNESIA) 400 MG/5ML suspension Take by mouth daily as needed for mild constipation.  . pantoprazole (PROTONIX) 40 MG tablet TK 1 T PO D-- takes in afternoon  . simvastatin (ZOCOR) 40 MG tablet TK 1 T PO HS     Allergies:   Amiodarone; Protamine; Amoxicillin; Celebrex [celecoxib]; Clindamycin; Lisinopril; Ace inhibitors; Cleocin [clindamycin hcl]; and Diflucan [fluconazole]   Social History   Socioeconomic History  . Marital status: Widowed    Spouse name: Not on file  . Number of children: Not on file  . Years of education: Not on file  . Highest education level: Not on file  Occupational History  . Not on file  Social Needs  . Financial resource strain: Not on file  . Food  insecurity:    Worry: Not on file    Inability: Not on file  . Transportation needs:    Medical: Not on file    Non-medical: Not on file  Tobacco Use  . Smoking status: Former Smoker    Packs/day: 0.75    Years: 30.00    Pack years: 22.50    Types: Cigarettes    Last attempt to quit: 12/21/2004    Years since quitting: 13.3  . Smokeless tobacco: Never Used  Substance and Sexual Activity  . Alcohol use: No  . Drug use: No  . Sexual activity: Not on file  Lifestyle  . Physical activity:    Days per week: Not on file    Minutes per session: Not on file  . Stress: Not on file  Relationships  . Social connections:    Talks on phone: Not on file    Gets together: Not on file    Attends religious service: Not on file    Active member of club or organization: Not on file    Attends meetings of clubs or organizations: Not on file    Relationship status: Not on file  Other Topics Concern  . Not on file  Social History Narrative  . Not on file     Family History: The patient's family history includes Heart attack in her father and sister; Liver cancer  in her maternal grandfather and maternal grandmother; Stroke in her mother and paternal grandmother. ROS:   Please see the history of present illness.    All other systems reviewed and are negative.  EKGs/Labs/Other Studies Reviewed:    The following studies were reviewed today:  EKG:  EKG ordered today.  The ekg ordered today shows sinus rhythm`  Recent Labs: 02/11/2018 cholesterol 263 HDL 79 LDL 163 but she is off her statin at that time creatinine 0.78. No results found for requested labs within last 8760 hours.  Recent Lipid Panel No results found for: CHOL, TRIG, HDL, CHOLHDL, VLDL, LDLCALC, LDLDIRECT  Physical Exam:    VS:  BP (!) 144/68 (BP Location: Right Arm, Patient Position: Sitting, Cuff Size: Normal)   Pulse 62   Ht 5\' 1"  (1.549 m)   Wt 140 lb 3.2 oz (63.6 kg)   SpO2 98%   BMI 26.49 kg/m     Wt Readings from Last 3 Encounters:  04/21/18 140 lb 3.2 oz (63.6 kg)  10/14/17 141 lb (64 kg)  03/16/17 139 lb (63 kg)     GEN:  Well nourished, well developed in no acute distress HEENT: Normal NECK: No JVD; No carotid bruits LYMPHATICS: No lymphadenopathy CARDIAC: RRR, no murmurs, rubs, gallops RESPIRATORY:  Clear to auscultation without rales, wheezing or rhonchi  ABDOMEN: Soft, non-tender, non-distended MUSCULOSKELETAL:  No edema; No deformity  SKIN: Warm and dry NEUROLOGIC:  Alert and oriented x 3 PSYCHIATRIC:  Normal affect    Signed, Shirlee More, MD  04/21/2018 1:03 PM    [facility]

## 2018-04-21 ENCOUNTER — Encounter: Payer: Self-pay | Admitting: Cardiology

## 2018-04-21 ENCOUNTER — Ambulatory Visit (INDEPENDENT_AMBULATORY_CARE_PROVIDER_SITE_OTHER): Payer: Medicare Other | Admitting: Cardiology

## 2018-04-21 ENCOUNTER — Ambulatory Visit (INDEPENDENT_AMBULATORY_CARE_PROVIDER_SITE_OTHER): Payer: Medicare Other

## 2018-04-21 VITALS — BP 144/68 | HR 62 | Ht 61.0 in | Wt 140.2 lb

## 2018-04-21 DIAGNOSIS — I6529 Occlusion and stenosis of unspecified carotid artery: Secondary | ICD-10-CM

## 2018-04-21 DIAGNOSIS — I10 Essential (primary) hypertension: Secondary | ICD-10-CM

## 2018-04-21 DIAGNOSIS — I480 Paroxysmal atrial fibrillation: Secondary | ICD-10-CM | POA: Diagnosis not present

## 2018-04-21 HISTORY — DX: Occlusion and stenosis of unspecified carotid artery: I65.29

## 2018-04-21 NOTE — Progress Notes (Signed)
Complete carotid has been performed. Grossly normal exam. Jimmy Tejay Hubert RDCS

## 2018-04-21 NOTE — Patient Instructions (Signed)
Medication Instructions:  Your physician recommends that you continue on your current medications as directed. Please refer to the Current Medication list given to you today.   Labwork: NONE  Testing/Procedures: You had an EKG today  Your physician has requested that you have a carotid duplex. This test is an ultrasound of the carotid arteries in your neck. It looks at blood flow through these arteries that supply the brain with blood. Allow one hour for this exam. There are no restrictions or special instructions.    Follow-Up: Your physician wants you to follow-up in: 6 months.  You will receive a reminder letter in the mail two months in advance. If you don't receive a letter, please call our office to schedule the follow-up appointment.   Any Other Special Instructions Will Be Listed Below (If Applicable).     If you need a refill on your cardiac medications before your next appointment, please call your pharmacy.

## 2018-05-27 DIAGNOSIS — I10 Essential (primary) hypertension: Secondary | ICD-10-CM | POA: Diagnosis not present

## 2018-05-27 DIAGNOSIS — K219 Gastro-esophageal reflux disease without esophagitis: Secondary | ICD-10-CM | POA: Diagnosis not present

## 2018-05-27 DIAGNOSIS — E039 Hypothyroidism, unspecified: Secondary | ICD-10-CM | POA: Diagnosis not present

## 2018-05-27 DIAGNOSIS — E785 Hyperlipidemia, unspecified: Secondary | ICD-10-CM | POA: Diagnosis not present

## 2018-05-27 DIAGNOSIS — Z79899 Other long term (current) drug therapy: Secondary | ICD-10-CM | POA: Diagnosis not present

## 2018-05-31 DIAGNOSIS — N3592 Unspecified urethral stricture, female: Secondary | ICD-10-CM | POA: Diagnosis not present

## 2018-05-31 DIAGNOSIS — N3020 Other chronic cystitis without hematuria: Secondary | ICD-10-CM | POA: Diagnosis not present

## 2018-05-31 DIAGNOSIS — R351 Nocturia: Secondary | ICD-10-CM | POA: Diagnosis not present

## 2018-06-02 DIAGNOSIS — H6692 Otitis media, unspecified, left ear: Secondary | ICD-10-CM | POA: Diagnosis not present

## 2018-06-15 DIAGNOSIS — H9201 Otalgia, right ear: Secondary | ICD-10-CM | POA: Diagnosis not present

## 2018-06-15 DIAGNOSIS — G501 Atypical facial pain: Secondary | ICD-10-CM | POA: Diagnosis not present

## 2018-06-15 DIAGNOSIS — G4489 Other headache syndrome: Secondary | ICD-10-CM | POA: Diagnosis not present

## 2018-06-15 DIAGNOSIS — H903 Sensorineural hearing loss, bilateral: Secondary | ICD-10-CM | POA: Diagnosis not present

## 2018-06-20 ENCOUNTER — Other Ambulatory Visit: Payer: Self-pay | Admitting: Otolaryngology

## 2018-06-20 DIAGNOSIS — G501 Atypical facial pain: Secondary | ICD-10-CM

## 2018-06-20 DIAGNOSIS — H9201 Otalgia, right ear: Secondary | ICD-10-CM

## 2018-07-05 ENCOUNTER — Ambulatory Visit
Admission: RE | Admit: 2018-07-05 | Discharge: 2018-07-05 | Disposition: A | Discharge status: Home / Self Care | Payer: Medicare Other | Source: Ambulatory Visit | Attending: Otolaryngology | Admitting: Otolaryngology

## 2018-07-05 DIAGNOSIS — G501 Atypical facial pain: Secondary | ICD-10-CM

## 2018-07-05 DIAGNOSIS — H9201 Otalgia, right ear: Secondary | ICD-10-CM

## 2018-07-05 MED ORDER — GADOBENATE DIMEGLUMINE 529 MG/ML IV SOLN
13.0000 mL | Freq: Once | INTRAVENOUS | Status: AC | PRN
  Administered 2018-07-05: 13 mL via INTRAVENOUS

## 2018-07-18 DIAGNOSIS — H903 Sensorineural hearing loss, bilateral: Secondary | ICD-10-CM | POA: Diagnosis not present

## 2018-07-18 DIAGNOSIS — G501 Atypical facial pain: Secondary | ICD-10-CM | POA: Diagnosis not present

## 2018-07-18 DIAGNOSIS — G4489 Other headache syndrome: Secondary | ICD-10-CM | POA: Diagnosis not present

## 2018-07-18 DIAGNOSIS — H9201 Otalgia, right ear: Secondary | ICD-10-CM | POA: Diagnosis not present

## 2018-07-24 IMAGING — NM NM MISC PROCEDURE
3 series · 18 of 18 positions shown · non-contrast
Comparison: none

[Series 1: wbr_s-proj_st stress_(id)_sa · 6.5mm · 6.51mm/px · 6 of 512 frames shown (1 of 2)]
[frame 43/512]
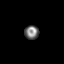
[frame 128/512]
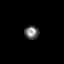
[frame 214/512]
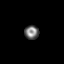
[frame 299/512]
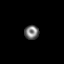
[frame 384/512]
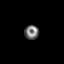
[frame 470/512]
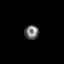

[Series 1: wbr_r-proj_st rest_(id)_sa · 6.5mm · 6.51mm/px · 6 of 64 frames shown]
[frame 6/64]
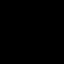
[frame 16/64]
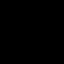
[frame 27/64]
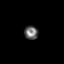
[frame 38/64]
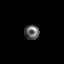
[frame 48/64]
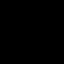
[frame 59/64]
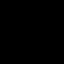

[Series 1: wbr_s-proj_st stress_(id)_sa · 6.5mm · 6.51mm/px · 6 of 64 frames shown (2 of 2)]
[frame 6/64]
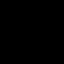
[frame 16/64]
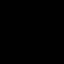
[frame 27/64]
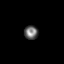
[frame 38/64]
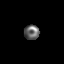
[frame 48/64]
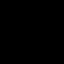
[frame 59/64]
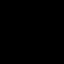

[18 of 18 positions shown; findings below may reference images not displayed]

Canned report from images found in remote index.

Refer to host system for actual result text.

## 2018-07-27 ENCOUNTER — Encounter: Payer: Self-pay | Admitting: Neurology

## 2018-08-29 DIAGNOSIS — I10 Essential (primary) hypertension: Secondary | ICD-10-CM | POA: Diagnosis not present

## 2018-08-29 DIAGNOSIS — K219 Gastro-esophageal reflux disease without esophagitis: Secondary | ICD-10-CM | POA: Diagnosis not present

## 2018-08-29 DIAGNOSIS — E785 Hyperlipidemia, unspecified: Secondary | ICD-10-CM | POA: Diagnosis not present

## 2018-08-29 DIAGNOSIS — Z79899 Other long term (current) drug therapy: Secondary | ICD-10-CM | POA: Diagnosis not present

## 2018-08-29 DIAGNOSIS — E039 Hypothyroidism, unspecified: Secondary | ICD-10-CM | POA: Diagnosis not present

## 2018-09-01 DIAGNOSIS — E785 Hyperlipidemia, unspecified: Secondary | ICD-10-CM | POA: Diagnosis not present

## 2018-09-01 DIAGNOSIS — N959 Unspecified menopausal and perimenopausal disorder: Secondary | ICD-10-CM | POA: Diagnosis not present

## 2018-09-01 DIAGNOSIS — Z1331 Encounter for screening for depression: Secondary | ICD-10-CM | POA: Diagnosis not present

## 2018-09-01 DIAGNOSIS — Z1231 Encounter for screening mammogram for malignant neoplasm of breast: Secondary | ICD-10-CM | POA: Diagnosis not present

## 2018-09-01 DIAGNOSIS — Z0000 Encounter for general adult medical examination without abnormal findings: Secondary | ICD-10-CM | POA: Diagnosis not present

## 2018-09-01 DIAGNOSIS — Z9181 History of falling: Secondary | ICD-10-CM | POA: Diagnosis not present

## 2018-09-07 DIAGNOSIS — H40053 Ocular hypertension, bilateral: Secondary | ICD-10-CM | POA: Diagnosis not present

## 2018-09-27 DIAGNOSIS — E039 Hypothyroidism, unspecified: Secondary | ICD-10-CM | POA: Diagnosis not present

## 2018-09-30 ENCOUNTER — Ambulatory Visit: Payer: Medicare Other | Admitting: Neurology

## 2018-10-03 ENCOUNTER — Ambulatory Visit (INDEPENDENT_AMBULATORY_CARE_PROVIDER_SITE_OTHER): Payer: Medicare Other | Admitting: Neurology

## 2018-10-03 ENCOUNTER — Other Ambulatory Visit (INDEPENDENT_AMBULATORY_CARE_PROVIDER_SITE_OTHER): Payer: Medicare Other

## 2018-10-03 ENCOUNTER — Encounter: Payer: Self-pay | Admitting: Neurology

## 2018-10-03 VITALS — BP 124/58 | HR 62 | Ht 61.0 in | Wt 140.0 lb

## 2018-10-03 DIAGNOSIS — R51 Headache: Secondary | ICD-10-CM

## 2018-10-03 DIAGNOSIS — R519 Headache, unspecified: Secondary | ICD-10-CM

## 2018-10-03 DIAGNOSIS — G8929 Other chronic pain: Secondary | ICD-10-CM

## 2018-10-03 LAB — SEDIMENTATION RATE: SED RATE: 34 mm/h — AB (ref 0–30)

## 2018-10-03 MED ORDER — GABAPENTIN 100 MG PO CAPS: ORAL_CAPSULE | ORAL | 0 refills | Status: DC

## 2018-10-03 NOTE — Progress Notes (Signed)
NEUROLOGY CONSULTATION NOTE  [name] MRN: [id number] DOB: 1941/06/20  Referring provider: Vicie Mutters, MD Primary care provider: Nelda Bucks, MD  Reason for consult:  Right-sided headache  HISTORY OF PRESENT ILLNESS: [name] is a 78 year old right-handed Caucasian woman with atrial fibrillation, CHF, hypertension, hyperlipidemia and hypothyroidism who presents for right-sided headache.  History supplemented by ENT note.  Symptoms began around 2017.  She reports right sided pressure, including aural fullness.  At first it was intermittent but now it is constant.  It is not an ache but sometimes a mild ache around the temple.  Her head (temple, scalp) is tender to palpation.  She reports neck soreness as well.  She has some sensitivity to light and sound.  She had one episode of vertigo but otherwise not associated with vertigo.  There is no associated tinnitus, nausea, vomiting.  No associated autonomic symptoms such as eye lacrimation, nasal congestion, conjunctival injection or ptosis.  Warm washcloth and artifical tears helps soothe it.  She recently had an eye exam that was not concerning.  She also treats it with Tylenol.    She was evaluated by Dr. Thornell Mule at the [facility].  She did exhibited some bilateral high-frequency sensorineural hearing loss but nothing significant or anything to explain her symptoms.  She did not exhibit sinusitis or mastoiditis.  Treatment included prednisone (caused elevated heart rate), as well as Zithromax and Kenalog which were ineffective.    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   PAST  MEDICAL HISTORY: Past Medical History:  Diagnosis Date  .  Possible Neuropathy 04/16/2015  . Adverse effect of drug 04/16/2015  . Anesthesia to pain 03/02/2017   Overview:  reaction to Protamine with 2nd cardiac ablation.  . Anticoagulant long-term use    ELIQUIS  . Anticoagulated 06/20/2016  . Anxiety 12/05/2015  . Atrial fibrillation ([place]) 01/10/2014  . Atrial flutter by electrocardiogram ([place]) 06/06/2014  . Atrial flutter, paroxysmal ([place])   . Atypical atrial flutter ([place]) 10/21/2016  . Chronic anticoagulation 02/13/2015  . Congenital anomaly of pulmonary vein    per Cardiac [facility] done at [facility] 02-16-2014-- noted 5 pulmonary veins (3 on the right and 2 on the left ) to left atrium  . Congestive heart failure ([place]) 01/10/2014  . Current use of long term anticoagulation 02/13/2015  . Drug reaction to Amoxicillin 04/16/2015  . Eczema 04/16/2015  . Encounter for monitoring sotalol therapy 06/06/2014  . Encounter for therapeutic drug level monitoring 06/06/2014  . Esophageal reflux 01/10/2014  . First degree heart block   . Gastroesophageal reflux disease 01/10/2014  . GERD (gastroesophageal reflux disease)   . High risk medication use 02/13/2015   Overview:  Ticosyn  . History of amiodarone therapy 06/20/2016  . History of diverticulitis 06/25/2016  . History of diverticulitis of colon    04/ 2017  . Hyperlipidemia   . Hypertension   . Hyponatremia 05/17/2016  . Hypothyroid 12/05/2015  . Hypothyroidism   . Hypothyroidism (acquired) 01/10/2014  . Long term current use of antiarrhythmic drug 10/15/2016  . On amiodarone therapy 03/23/2016  . Ovarian cyst 12/05/2015  . PAF (paroxysmal atrial fibrillation) [facility] cardiologist- dr Bettina Gavia [facility])  .  Paroxysmal atrial fibrillation ([place]) 02/13/2015   Overview:  had pulmonary vein ablation for AF in 2010 and redo for recurrence in April 2012  * CHADS2 score=2  Overview:  had pulmonary vein ablation for AF in 2010 and redo for recurrence in April 2012  * CHADS2 score=2   . PAT (paroxysmal atrial tachycardia) ([place])   . Rectocele 12/05/2015  . S/P ablation of atrial fibrillation 2011  &  2012  &  02-19-2014 at [place] cardiologist-- dr Norm Salt ([place])  . Systolic CHF ([place])   . Urethral stenosis   . Urethral stricture   . Ventral hernia 05/17/2016  . Wears glasses     PAST SURGICAL HISTORY: Past Surgical History:  Procedure Laterality Date  . ABDOMINAL HERNIA REPAIR  10/ 2017   at [facility]  and 1989  . ABDOMINAL HYSTERECTOMY  1986  . ANTERIOR REPAIR AND TRANSOBTURATOR SLING  09-08-2010   CYSTOCELE AND SUI  . CARDIAC CATHETERIZATION  1991 ([facility]) &  2006 (dr Lia Foyer)   lvf preserved/ 45-50% mid rca/  40% lad after takeoff diagonal branch  . CARDIAC ELECTROPHYSIOLOGY STUDY AND ABLATION  x3 2011  &  2012  ([facility]) and 02-19-2014 at [place]   per EP study report 02-19-2014-- re-do PVI (LIPV & RSPV re-isolated) left atrial roof line & CTI line performed  . CARDIOVASCULAR STRESS TEST  04-08-2009     normal study/ ef 65%  . CARDIOVERSION  last one 09-01-2016  in [place]  . CYSTOSCOPY WITH URETHRAL DILATATION N/A 12/23/2012   Procedure: CYSTOSCOPY WITH BALLOON DILATATION;  Surgeon: Claybon Jabs, MD;  Location: [facility];  Service: Urology;  Laterality: N/A;  . TRANSESOPHAGEAL ECHOCARDIOGRAM  05-20-2009   normal lvf and structure/ ef 60-65%/ no thrombus  . TUBAL LIGATION  age 65    MEDICATIONS: Current Outpatient Medications on File Prior to Visit  Medication Sig Dispense Refill  . acetaminophen (TYLENOL) 500 MG tablet Take by mouth every 6 (six) hours as needed.     . ALPRAZolam (XANAX) 0.25 MG tablet Take 0.25 mg by mouth 2 (two) times daily as needed.     . dicyclomine (BENTYL) 10 MG capsule Take 10 mg by mouth as needed.     . diltiazem (CARDIZEM CD) 180 MG 24 hr capsule Take 1 capsule (180 mg total) by mouth 2 (two) times daily. Takes at 0830 180 capsule 1  . ELIQUIS 5 MG TABS tablet Take 5 mg by mouth 2  (two) times daily. Pt aware to stop 3 days prior to [facility] 10-16-2016    . levothyroxine (SYNTHROID, LEVOTHROID) 100 MCG tablet Take 100 mcg by mouth daily before breakfast.    . loratadine (CLARITIN) 10 MG tablet Take 10 mg by mouth as needed for allergies.    Marland Kitchen losartan (COZAAR) 100 MG tablet TK 1 T PO HS    . magnesium hydroxide (MILK OF MAGNESIA) 400 MG/5ML suspension Take by mouth daily as needed for mild constipation.    . pantoprazole (PROTONIX) 40 MG tablet TK 1 T PO D-- takes in afternoon    . simvastatin (ZOCOR) 40 MG tablet TK 1 T PO HS     No current facility-administered medications on file prior to visit.     ALLERGIES: Allergies  Allergen Reactions  . Amiodarone Nausea Only  . Protamine Anaphylaxis  . Amoxicillin Rash  . Celebrex [Celecoxib] Rash  . Clindamycin Rash  . Lisinopril Other (See Comments) and Cough  cough  . Ace Inhibitors Cough  . Cleocin [Clindamycin Hcl] Rash  . Diflucan [Fluconazole] Rash    Recently took without complication 0/93/26    FAMILY HISTORY: Family History  Problem Relation Age of Onset  . Stroke Mother   . Heart attack Father   . Heart attack Sister   . Liver cancer Maternal Grandmother   . Liver cancer Maternal Grandfather   . Stroke Paternal Grandmother    SOCIAL HISTORY: Social History   Socioeconomic History  . Marital status: Widowed    Spouse name: Not on file  . Number of children: Not on file  . Years of education: Not on file  . Highest education level: Not on file  Occupational History  . Not on file  Social Needs  . Financial resource strain: Not on file  . Food insecurity:    Worry: Not on file    Inability: Not on file  . Transportation needs:    Medical: Not on file    Non-medical: Not on file  Tobacco Use  . Smoking status: Former Smoker    Packs/day: 0.75    Years: 30.00    Pack years: 22.50    Types: Cigarettes    Last attempt to quit: 12/21/2004    Years since quitting: 13.7  . Smokeless  tobacco: Never Used  Substance and Sexual Activity  . Alcohol use: No  . Drug use: No  . Sexual activity: Not on file  Lifestyle  . Physical activity:    Days per week: Not on file    Minutes per session: Not on file  . Stress: Not on file  Relationships  . Social connections:    Talks on phone: Not on file    Gets together: Not on file    Attends religious service: Not on file    Active member of club or organization: Not on file    Attends meetings of clubs or organizations: Not on file    Relationship status: Not on file  . Intimate partner violence:    Fear of current or ex partner: Not on file    Emotionally abused: Not on file    Physically abused: Not on file    Forced sexual activity: Not on file  Other Topics Concern  . Not on file  Social History Narrative  . Not on file    REVIEW OF SYSTEMS: Constitutional: No fevers, chills, or sweats, no generalized fatigue, change in appetite Eyes: No visual changes, double vision, eye pain Ear, nose and throat: No hearing loss, ear pain, nasal congestion, sore throat Cardiovascular: No chest pain, palpitations Respiratory:  No shortness of breath at rest or with exertion, wheezes GastrointestinaI: No nausea, vomiting, diarrhea, abdominal pain, fecal incontinence Genitourinary:  No dysuria, urinary retention or frequency Musculoskeletal:  Neck pain Integumentary: No rash, pruritus, skin lesions Neurological: as above Psychiatric: No depression, insomnia, anxiety Endocrine: No palpitations, fatigue, diaphoresis, mood swings, change in appetite, change in weight, increased thirst Hematologic/Lymphatic:  No purpura, petechiae. Allergic/Immunologic: no itchy/runny eyes, nasal congestion, recent allergic reactions, rashes  PHYSICAL EXAM: Blood pressure (!) 124/58, pulse 62, height 5\' 1"  (1.549 m), weight 140 lb (63.5 kg), SpO2 95 %. General: No acute distress.  Patient appears well-groomed.   Head:   Normocephalic/atraumatic Eyes:  fundi examined but not visualized Neck: supple, paraspinal tenderness, full range of motion Back: No paraspinal tenderness Heart: regular rate and rhythm Lungs: Clear to auscultation bilaterally. Vascular: No carotid bruits. Neurological Exam: Mental status: alert  and oriented to person, place, and time, recent and remote memory intact, fund of knowledge intact, attention and concentration intact, speech fluent and not dysarthric, language intact. Cranial nerves: CN I: not tested CN II: pupils equal, round and reactive to light, visual fields intact CN III, IV, VI:  full range of motion, no nystagmus, no ptosis CN V: facial sensation intact CN VII: upper and lower face symmetric CN VIII: slight decreased hearing in right ear. CN IX, X: gag intact, uvula midline CN XI: sternocleidomastoid and trapezius muscles intact CN XII: tongue midline Bulk & Tone: normal, no fasciculations. Motor:  5/5 throughout  Sensation:  temperature and vibration sensation intact.   Deep Tendon Reflexes:  Slightly brisk throughout, toes downgoing.  Finger to nose testing:  Without dysmetria.   Heel to shin:  Without dysmetria.  Gait:  Normal station and stride.  Romberg negative.  IMPRESSION: Chronic right-sided headache.  Unclear etiology.  Consider hemicrania continua but patient does not exhibit typical autonomic symptoms.  Due to side effects to NSAIDs, unable to try indomethacin trial.  Neurologic exam unremarkable.  PLAN: 1.  Start gabapentin 100mg  at bedtime, titrating to 300mg  twice daily 2.  As she denies visual disturbance and has had unremarkable eye exams, temporal arteritis unlikely but will still check sed rate 3.  Follow up in 4 months.  Thank you for allowing me to take part in the care of this patient.  Metta Clines, DO  CC:  Nelda Bucks, MD  Vicie Mutters, MD

## 2018-10-03 NOTE — Patient Instructions (Addendum)
1.  We will start gabapentin 100mg :  Take 1 capsule at bedtime for 7 days  Then 1 capsule twice daily for 7 days  Then 2 capsules twice daily for 7 days  Then 3 capsules twice daily 2.  Check Sed Rate 3.  Follow up in 4 months.    Your provider has requested that you have labwork completed today. Please go to [facility] ([id number]) on the second floor of this building before leaving the office today. You do not need to check in. If you are not called within 15 minutes please check with the front desk.

## 2018-10-06 DIAGNOSIS — J019 Acute sinusitis, unspecified: Secondary | ICD-10-CM | POA: Diagnosis not present

## 2018-10-10 DIAGNOSIS — J342 Deviated nasal septum: Secondary | ICD-10-CM | POA: Diagnosis not present

## 2018-10-10 DIAGNOSIS — H905 Unspecified sensorineural hearing loss: Secondary | ICD-10-CM | POA: Diagnosis not present

## 2018-10-10 DIAGNOSIS — H938X1 Other specified disorders of right ear: Secondary | ICD-10-CM | POA: Diagnosis not present

## 2018-10-10 DIAGNOSIS — H6983 Other specified disorders of Eustachian tube, bilateral: Secondary | ICD-10-CM | POA: Diagnosis not present

## 2018-10-12 ENCOUNTER — Telehealth: Payer: Self-pay

## 2018-10-12 NOTE — Telephone Encounter (Signed)
-----   Message from Pieter Partridge, DO sent at 10/03/2018  1:49 PM EST ----- Sed rate is unremarkable

## 2018-10-12 NOTE — Telephone Encounter (Signed)
Called Pt, LM on answering machine advising of lab results, and to call with any questions

## 2018-10-18 DIAGNOSIS — J019 Acute sinusitis, unspecified: Secondary | ICD-10-CM | POA: Diagnosis not present

## 2018-10-20 DIAGNOSIS — N3020 Other chronic cystitis without hematuria: Secondary | ICD-10-CM | POA: Diagnosis not present

## 2018-10-20 DIAGNOSIS — R351 Nocturia: Secondary | ICD-10-CM | POA: Diagnosis not present

## 2018-10-20 DIAGNOSIS — N3592 Unspecified urethral stricture, female: Secondary | ICD-10-CM | POA: Diagnosis not present

## 2018-11-03 ENCOUNTER — Ambulatory Visit: Payer: Medicare Other | Admitting: Cardiology

## 2018-11-03 DIAGNOSIS — N318 Other neuromuscular dysfunction of bladder: Secondary | ICD-10-CM | POA: Diagnosis not present

## 2018-11-03 DIAGNOSIS — N3020 Other chronic cystitis without hematuria: Secondary | ICD-10-CM | POA: Diagnosis not present

## 2018-11-03 DIAGNOSIS — N3592 Unspecified urethral stricture, female: Secondary | ICD-10-CM | POA: Diagnosis not present

## 2018-11-09 DIAGNOSIS — I10 Essential (primary) hypertension: Secondary | ICD-10-CM | POA: Diagnosis not present

## 2018-11-21 DIAGNOSIS — I10 Essential (primary) hypertension: Secondary | ICD-10-CM | POA: Diagnosis not present

## 2018-11-23 DIAGNOSIS — I10 Essential (primary) hypertension: Secondary | ICD-10-CM | POA: Diagnosis not present

## 2018-11-23 DIAGNOSIS — I4891 Unspecified atrial fibrillation: Secondary | ICD-10-CM | POA: Diagnosis not present

## 2018-11-23 DIAGNOSIS — J019 Acute sinusitis, unspecified: Secondary | ICD-10-CM | POA: Diagnosis not present

## 2018-11-26 DIAGNOSIS — I4891 Unspecified atrial fibrillation: Secondary | ICD-10-CM | POA: Diagnosis not present

## 2018-11-26 DIAGNOSIS — I10 Essential (primary) hypertension: Secondary | ICD-10-CM | POA: Diagnosis not present

## 2018-11-28 DIAGNOSIS — I10 Essential (primary) hypertension: Secondary | ICD-10-CM | POA: Diagnosis not present

## 2018-11-28 DIAGNOSIS — F419 Anxiety disorder, unspecified: Secondary | ICD-10-CM | POA: Diagnosis not present

## 2018-11-29 DIAGNOSIS — I10 Essential (primary) hypertension: Secondary | ICD-10-CM | POA: Diagnosis not present

## 2018-11-29 DIAGNOSIS — K219 Gastro-esophageal reflux disease without esophagitis: Secondary | ICD-10-CM | POA: Diagnosis not present

## 2018-11-29 DIAGNOSIS — E785 Hyperlipidemia, unspecified: Secondary | ICD-10-CM | POA: Diagnosis not present

## 2018-11-29 DIAGNOSIS — E039 Hypothyroidism, unspecified: Secondary | ICD-10-CM | POA: Diagnosis not present

## 2018-11-29 DIAGNOSIS — Z79899 Other long term (current) drug therapy: Secondary | ICD-10-CM | POA: Diagnosis not present

## 2018-12-08 DIAGNOSIS — I10 Essential (primary) hypertension: Secondary | ICD-10-CM | POA: Diagnosis not present

## 2018-12-10 DIAGNOSIS — I10 Essential (primary) hypertension: Secondary | ICD-10-CM | POA: Diagnosis not present

## 2018-12-12 ENCOUNTER — Other Ambulatory Visit: Payer: Self-pay

## 2018-12-12 ENCOUNTER — Telehealth: Payer: Self-pay | Admitting: Cardiology

## 2018-12-12 MED ORDER — TELMISARTAN 20 MG PO TABS: 20.0000 mg | ORAL_TABLET | Freq: Every day | ORAL | 5 refills | Status: DC

## 2018-12-12 NOTE — Telephone Encounter (Signed)
Is having issues with her BP medicines and wants to discuss Please call patient at [phone]

## 2018-12-12 NOTE — Telephone Encounter (Signed)
Called patient and informed that Dr. Bettina Gavia is wants her to stop norvasc and start Telmisartan.  Telmisartan prescription sent to [facility] on [facility] per pt request.

## 2018-12-12 NOTE — Telephone Encounter (Signed)
Patient reports having side effects from BP medications. She was prescribed losartan, the drug manufacturer changed and she started to feel sick,  PCP Dr. Delena Bali changed her to lisinopril which also made her feel sick, changed her to Norvasc which is also making her feel sick. She reports the side effects from all 3 medications as waking up in the night headache, feeling flushed, nausea, but these often occur during the day as well.     BP 152/69, HR 67 and weight 140#. Her PCP would like Dr. Joya Gaskins input on an appropriate BP medication. Pt states PCP feels the side effects are due to anxiety. Patient does report anxiety and takes xanax at bedtime and prn during the day. She also has hx of reflux and takes protonix 40 mg daily for that.   pls advise, tx

## 2018-12-12 NOTE — Telephone Encounter (Signed)
I think we do best to discontinue Norvasc and to use telmisartan 20 mg daily #90 and over the next few weeks I think her systolics will come into line and she will be at target less than 087 systolic

## 2018-12-13 ENCOUNTER — Other Ambulatory Visit: Payer: Self-pay

## 2018-12-13 DIAGNOSIS — I10 Essential (primary) hypertension: Secondary | ICD-10-CM

## 2018-12-13 DIAGNOSIS — I480 Paroxysmal atrial fibrillation: Secondary | ICD-10-CM

## 2018-12-13 MED ORDER — DILTIAZEM HCL ER COATED BEADS 180 MG PO CP24: 180.0000 mg | ORAL_CAPSULE | Freq: Two times a day (BID) | ORAL | 1 refills | Status: DC

## 2018-12-13 NOTE — Telephone Encounter (Signed)
Pt. Reports that after taking her first dose of telmisartan 20 mg at 8 pm last night she woke up at 3 am feeling sick in the back of her throat, as if she could hardly swallow, nauseous,  and with a burning headache. She wonders if Dr. Bettina Gavia would like her to continue and see if these symptoms resolve or change her medication therapy.   pls advise, tx

## 2018-12-13 NOTE — Telephone Encounter (Signed)
Phoned patient and informed that Dr. Bettina Gavia feels her symptoms are unrelated to telmisartan. Informed patient he wants her to follow-up with her PCP regarding this. Suggested taking telmisartan with food and/or close to or with her protonix. Patient verbalizes understanding has no further questions or concerns.

## 2018-12-13 NOTE — Telephone Encounter (Signed)
See her PCP unrelated to meds

## 2018-12-13 NOTE — Telephone Encounter (Signed)
Patient started taking the telmisartan (MICARDIS) 20 MG tablet BJM perscribed for her and she says she has been up since 3am "sick as dog".  Wants to know if there is another recommendation or does he think if she stays on it, her being sick will go away?  Please call patient to discuss

## 2019-01-06 DIAGNOSIS — R1013 Epigastric pain: Secondary | ICD-10-CM | POA: Diagnosis not present

## 2019-01-16 ENCOUNTER — Telehealth: Payer: Self-pay | Admitting: Cardiology

## 2019-01-16 NOTE — Telephone Encounter (Signed)
Virtual Visit Pre-Appointment Phone Call  "(Name), I am calling you today to discuss your upcoming appointment. We are currently trying to limit exposure to the virus that causes COVID-19 by seeing patients at home rather than in the office."  1. "What is the BEST phone number to call the day of the visit?" - include this in appointment notes  2. Do you have or have access to (through a family member/friend) a smartphone with video capability that we can use for your visit?" a. If yes - list this number in appt notes as cell (if different from BEST phone #) and list the appointment type as a VIDEO visit in appointment notes b. If no - list the appointment type as a PHONE visit in appointment notes  3. Confirm consent - "In the setting of the current Covid19 crisis, you are scheduled for a (phone or video) visit with your provider on (date) at (time).  Just as we do with many in-office visits, in order for you to participate in this visit, we must obtain consent.  If you'd like, I can send this to your mychart (if signed up) or email for you to review.  Otherwise, I can obtain your verbal consent now.  All virtual visits are billed to your insurance company just like a normal visit would be.  By agreeing to a virtual visit, we'd like you to understand that the technology does not allow for your provider to perform an examination, and thus may limit your provider's ability to fully assess your condition. If your provider identifies any concerns that need to be evaluated in person, we will make arrangements to do so.  Finally, though the technology is pretty good, we cannot assure that it will always work on either your or our end, and in the setting of a video visit, we may have to convert it to a phone-only visit.  In either situation, we cannot ensure that we have a secure connection.  Are you willing to proceed?" STAFF: Did the patient verbally acknowledge consent to telehealth visit? Document  YES/NO here: Yes  4. Advise patient to be prepared - "Two hours prior to your appointment, go ahead and check your blood pressure, pulse, oxygen saturation, and your weight (if you have the equipment to check those) and write them all down. When your visit starts, your provider will ask you for this information. If you have an Apple Watch or Kardia device, please plan to have heart rate information ready on the day of your appointment. Please have a pen and paper handy nearby the day of the visit as well."  5. Give patient instructions for MyChart download to smartphone OR Doximity/Doxy.me as below if video visit (depending on what platform provider is using)  6. Inform patient they will receive a phone call 15 minutes prior to their appointment time (may be from unknown caller ID) so they should be prepared to answer    TELEPHONE CALL NOTE  [name] has been deemed a candidate for a follow-up tele-health visit to limit community exposure during the Covid-19 pandemic. I spoke with the patient via phone to ensure availability of phone/video source, confirm preferred email & phone number, and discuss instructions and expectations.  I reminded [name] to be prepared with any vital sign and/or heart rhythm information that could potentially be obtained via home monitoring, at the time of her visit. I reminded [name] to expect a phone call prior to her visit.  Calla Kicks 01/16/2019 2:20 PM   INSTRUCTIONS FOR DOWNLOADING THE MYCHART APP TO SMARTPHONE  - The patient must first make sure to have activated MyChart and know their login information - If Apple, go to [employer] and type in MyChart in the search bar and download the app. If Android, ask patient to go to [employer] and type in Dexter in the search bar and download the app. The app is free but as with any other app downloads, their phone may require them to verify saved payment information or Apple/Android password.   - The patient will need to then log into the app with their MyChart username and password, and select [facility] as their healthcare provider to link the account. When it is time for your visit, go to the MyChart app, find appointments, and click Begin Video Visit. Be sure to Select Allow for your device to access the Microphone and Camera for your visit. You will then be connected, and your provider will be with you shortly.  **If they have any issues connecting, or need assistance please contact MyChart service desk [phone])**  **If using a computer, in order to ensure the best quality for their visit they will need to use either of the following Internet Browsers: [employer], or Google Chrome**  IF USING DOXIMITY or DOXY.ME - The patient will receive a link just prior to their visit by text.     FULL LENGTH CONSENT FOR TELE-HEALTH VISIT   I hereby voluntarily request, consent and authorize [place] and its employed or contracted physicians, physician assistants, nurse practitioners or other licensed health care professionals (the Practitioner), to provide me with telemedicine health care services (the Services") as deemed necessary by the treating Practitioner. I acknowledge and consent to receive the Services by the Practitioner via telemedicine. I understand that the telemedicine visit will involve communicating with the Practitioner through live audiovisual communication technology and the disclosure of certain medical information by electronic transmission. I acknowledge that I have been given the opportunity to request an in-person assessment or other available alternative prior to the telemedicine visit and am voluntarily participating in the telemedicine visit.  I understand that I have the right to withhold or withdraw my consent to the use of telemedicine in the course of my care at any time, without affecting my right to future care or treatment, and that  the Practitioner or I may terminate the telemedicine visit at any time. I understand that I have the right to inspect all information obtained and/or recorded in the course of the telemedicine visit and may receive copies of available information for a reasonable fee.  I understand that some of the potential risks of receiving the Services via telemedicine include:   Delay or interruption in medical evaluation due to technological equipment failure or disruption;  Information transmitted may not be sufficient (e.g. poor resolution of images) to allow for appropriate medical decision making by the Practitioner; and/or   In rare instances, security protocols could fail, causing a breach of personal health information.  Furthermore, I acknowledge that it is my responsibility to provide information about my medical history, conditions and care that is complete and accurate to the best of my ability. I acknowledge that Practitioner's advice, recommendations, and/or decision may be based on factors not within their control, such as incomplete or inaccurate data provided by me or distortions of diagnostic images or specimens that may result from electronic transmissions. I understand that the  practice of medicine is not an Chief Strategy Officer and that Practitioner makes no warranties or guarantees regarding treatment outcomes. I acknowledge that I will receive a copy of this consent concurrently upon execution via email to the email address I last provided but may also request a printed copy by calling the office of [place].    I understand that my insurance will be billed for this visit.   I have read or had this consent read to me.  I understand the contents of this consent, which adequately explains the benefits and risks of the Services being provided via telemedicine.   I have been provided ample opportunity to ask questions regarding this consent and the Services and have had my questions answered to  my satisfaction.  I give my informed consent for the services to be provided through the use of telemedicine in my medical care  By participating in this telemedicine visit I agree to the above.

## 2019-01-16 NOTE — Progress Notes (Signed)
Virtual Visit via Telephone Note    She declined a virtual visit   This visit type was conducted due to national recommendations for restrictions regarding the COVID-19 Pandemic (e.g. social distancing) in an effort to limit this patient's exposure and mitigate transmission in our community.  Due to her co-morbid illnesses, this patient is at least at moderate risk for complications without adequate follow up.  This format is felt to be most appropriate for this patient at this time.  The patient did not have access to video technology/had technical difficulties with video requiring transitioning to audio format only (telephone).  All issues noted in this document were discussed and addressed.  No physical exam could be performed with this format.  Please refer to the patient's chart for her  consent to telehealth for [facility].   Date:  01/17/2019   ID:  Reina Fuse, DOB [birth year], MRN [id number]  Patient Location: Home Provider Location: Office  PCP:  Nicoletta Dress, MD  Cardiologist:  Shirlee More, MD  Electrophysiologist:  None   Evaluation Performed:  Follow-Up Visit  Chief Complaint:  78 yo female presents for 6 month follow up of chronic conditions including PAF, chronic anticoagulation, HTN, HLD.   History of Present Illness:    [name] is a 78 y.o. female with PAF, SVT, atrial flutter on chronic anticoagulation, mild CAD, bilateral carotid atherosclerosis, HTN, HLD last seen 04/21/2018.   Since that office visit she has had multiple intolerances of anti-hypertensive including Losartan, Lisinopril, Norvasc with reported symptoms of "feeling sick". She was started on Telmisartan.   The patient does not have symptoms concerning for COVID-19 infection (fever, chills, cough, or new shortness of breath).    Past Medical History:  Diagnosis Date  .  Possible Neuropathy 04/16/2015  . Adverse effect of drug 04/16/2015  . Anesthesia to pain 03/02/2017   Overview:   reaction to Protamine with 2nd cardiac ablation.  . Anticoagulant long-term use    ELIQUIS  . Anticoagulated 06/20/2016  . Anxiety 12/05/2015  . Atrial fibrillation ([place]) 01/10/2014  . Atrial flutter by electrocardiogram ([place]) 06/06/2014  . Atrial flutter, paroxysmal ([place])   . Atypical atrial flutter ([place]) 10/21/2016  . Chronic anticoagulation 02/13/2015  . Congenital anomaly of pulmonary vein    per Cardiac MRI done at [facility] 02-16-2014-- noted 5 pulmonary veins (3 on the right and 2 on the left ) to left atrium  . Congestive heart failure ([place]) 01/10/2014  . Current use of long term anticoagulation 02/13/2015  . Drug reaction to Amoxicillin 04/16/2015  . Eczema 04/16/2015  . Encounter for monitoring sotalol therapy 06/06/2014  . Encounter for therapeutic drug level monitoring 06/06/2014  . Esophageal reflux 01/10/2014  . First degree heart block   . Gastroesophageal reflux disease 01/10/2014  . GERD (gastroesophageal reflux disease)   . High risk medication use 02/13/2015   Overview:  Ticosyn  . History of amiodarone therapy 06/20/2016  . History of diverticulitis 06/25/2016  . History of diverticulitis of colon    04/ 2017  . Hyperlipidemia   . Hypertension   . Hyponatremia 05/17/2016  . Hypothyroid 12/05/2015  . Hypothyroidism   . Hypothyroidism (acquired) 01/10/2014  . Long term current use of antiarrhythmic drug 10/15/2016  . On amiodarone therapy 03/23/2016  . Ovarian cyst 12/05/2015  . PAF (paroxysmal atrial fibrillation) [facility]) cardiologist- dr Bettina Gavia [place])  . Paroxysmal atrial fibrillation ([place]) 02/13/2015   Overview:  had pulmonary vein ablation for AF in 2010 and redo for  recurrence in April 2012  * CHADS2 score=2  Overview:  had pulmonary vein ablation for AF in 2010 and redo for recurrence in April 2012  * CHADS2 score=2  . PAT (paroxysmal atrial tachycardia) ([place])   . Rectocele 12/05/2015  . S/P ablation of atrial fibrillation 2011  &  2012  &  02-19-2014 at [place] cardiologist-- dr  Norm Salt ([place])  . Systolic CHF ([employer])   . Urethral stenosis   . Urethral stricture   . Ventral hernia 05/17/2016  . Wears glasses    Past Surgical History:  Procedure Laterality Date  . ABDOMINAL HERNIA REPAIR  10/ 2017   at [facility]  and 1989  . ABDOMINAL HYSTERECTOMY  1986  . ANTERIOR REPAIR AND TRANSOBTURATOR SLING  09-08-2010   CYSTOCELE AND SUI  . CARDIAC CATHETERIZATION  1991 ([place]) &  2006 (dr Lia Foyer)   lvf preserved/ 45-50% mid rca/  40% lad after takeoff diagonal branch  . CARDIAC ELECTROPHYSIOLOGY STUDY AND ABLATION  x3 2011  &  2012  ([facility]) and 02-19-2014 at [place]   per EP study report 02-19-2014-- re-do PVI (LIPV & RSPV re-isolated) left atrial roof line & CTI line performed  . CARDIOVASCULAR STRESS TEST  04-08-2009     normal study/ ef 65%  . CARDIOVERSION  last one 09-01-2016  in [place]  . CYSTOSCOPY WITH URETHRAL DILATATION N/A 12/23/2012   Procedure: CYSTOSCOPY WITH BALLOON DILATATION;  Surgeon: Claybon Jabs, MD;  Location: [facility];  Service: Urology;  Laterality: N/A;  . TRANSESOPHAGEAL ECHOCARDIOGRAM  05-20-2009   normal lvf and structure/ ef 60-65%/ no thrombus  . TUBAL LIGATION  age 10     Current Meds  Medication Sig  . acetaminophen (TYLENOL) 500 MG tablet Take by mouth every 6 (six) hours as needed.   . ALPRAZolam (XANAX) 0.25 MG tablet Take 0.25 mg by mouth 2 (two) times daily as needed.   . dicyclomine (BENTYL) 10 MG capsule Take 10 mg by mouth as needed.   . diltiazem (CARDIZEM CD) 180 MG 24 hr capsule Take 1 capsule (180 mg total) by mouth 2 (two) times daily. Takes at [place]  . ELIQUIS 5 MG TABS tablet Take 5 mg by mouth 2 (two) times daily. Pt aware to stop 3 days prior to [facility] 10-16-2016  . levothyroxine (SYNTHROID, LEVOTHROID) 88 MCG tablet Take 88 mcg by mouth daily before breakfast.  . loratadine (CLARITIN) 10 MG tablet Take 10 mg by mouth as needed for allergies.  . magnesium hydroxide (MILK OF  MAGNESIA) 400 MG/5ML suspension Take by mouth daily as needed for mild constipation.  . pantoprazole (PROTONIX) 40 MG tablet TK 1 T PO D-- takes in afternoon  . senna (SENOKOT) 8.6 MG tablet Take 1 tablet by mouth daily as needed for constipation.     Allergies:   Amiodarone; Protamine; Amoxicillin; Celebrex [celecoxib]; Clindamycin; Lisinopril; Ace inhibitors; Cleocin [clindamycin hcl]; Diflucan [fluconazole]; Pravastatin; and Simvastatin   Social History   Tobacco Use  . Smoking status: Former Smoker    Packs/day: 0.75    Years: 30.00    Pack years: 22.50    Types: Cigarettes    Last attempt to quit: 12/21/2004    Years since quitting: 14.0  . Smokeless tobacco: Never Used  Substance Use Topics  . Alcohol use: No  . Drug use: No     Family Hx: The patient's family history includes Diabetes in her maternal grandfather and son; Heart attack  in her father and sister; Liver cancer in her maternal grandfather and maternal grandmother; Stroke in her mother and paternal grandmother.  ROS:   Please see the history of present illness.     All other systems reviewed and are negative.   Prior CV studies:   The following studies were reviewed today:  04/21/2018 Carotid Duplex:  Final Interpretation:  Right Carotid: There is no evidence of stenosis in the right ICA.  Left Carotid: There is no evidence of stenosis in the left ICA.  Vertebrals:  Bilateral vertebral arteries demonstrate antegrade flow.  Subclavians: Normal flow hemodynamics were seen in bilateral subclavian arteries.     Labs/Other Tests and Data Reviewed:    EKG:  An ECG dated 04/21/18 was personally reviewed today and demonstrated:  [place] non specific ST and t waves  Recent Labs: 11/20/18: CMP normal GFR 61 cc/min K 4.3 Chol 267 HDL 63 LDL 163 TSH T4 normal  Wt Readings from Last 3 Encounters:  01/17/19 137 lb 6.4 oz (62.3 kg)  10/03/18 140 lb (63.5 kg)  04/21/18 140 lb 3.2 oz (63.6 kg)     Objective:     Vital Signs:  BP (!) 144/75 (BP Location: Left Arm)   Pulse 62   Wt 137 lb 6.4 oz (62.3 kg)   BMI 25.96 kg/m    VITAL SIGNS:  reviewed Mood affect thought and cognition are normal no respiratory distress AO times 3   ASSESSMENT & PLAN:    1. Atrial very arrhythmia including atrial flutter paroxysmal atrial fibrillation with multiple EP interventions doing surprisingly well off antiarrhythmic drug with Cardizem and has had no clinical recurrence.  At this time I do think he requires ambulatory heart rhythm monitors side continue anticoagulation and continue calcium channel blocker that is giving her the best clinical response even over antiarrhythmic drugs and cardiac interventions.  She is comfortable with this approach 2. Mild CAD stable having no anginal discomfort unfortunately is absolutely intolerant of all conventional lipid-lowering therapy and is declined PCSK9. 3. Hypertension stable BP at home runs 1 73-4 30 systolic with a calcium channel blocker 4. Hyperlipidemia poorly controlled the patient declines lipid-lowering therapy at this time  COVID-19 Education: The signs and symptoms of COVID-19 were discussed with the patient and how to seek care for testing (follow up with PCP or arrange E-visit).  The importance of social distancing was discussed today.  Time:   Today, I have spent 20 minutes with the patient with telehealth technology discussing the above problems.     Medication Adjustments/Labs and Tests Ordered: Current medicines are reviewed at length with the patient today.  Concerns regarding medicines are outlined above.   Tests Ordered: No orders of the defined types were placed in this encounter.   Medication Changes: No orders of the defined types were placed in this encounter.   Disposition:  Follow up in 6 month(s)  Signed, Shirlee More, MD  01/17/2019 9:59 AM    [facility]

## 2019-01-17 ENCOUNTER — Encounter: Payer: Self-pay | Admitting: Cardiology

## 2019-01-17 ENCOUNTER — Telehealth (INDEPENDENT_AMBULATORY_CARE_PROVIDER_SITE_OTHER): Payer: Medicare Other | Admitting: Cardiology

## 2019-01-17 ENCOUNTER — Other Ambulatory Visit: Payer: Self-pay

## 2019-01-17 VITALS — BP 144/75 | HR 62 | Wt 137.4 lb

## 2019-01-17 DIAGNOSIS — Z7901 Long term (current) use of anticoagulants: Secondary | ICD-10-CM

## 2019-01-17 DIAGNOSIS — E782 Mixed hyperlipidemia: Secondary | ICD-10-CM

## 2019-01-17 DIAGNOSIS — I10 Essential (primary) hypertension: Secondary | ICD-10-CM

## 2019-01-17 DIAGNOSIS — I480 Paroxysmal atrial fibrillation: Secondary | ICD-10-CM

## 2019-01-17 DIAGNOSIS — I484 Atypical atrial flutter: Secondary | ICD-10-CM

## 2019-01-17 DIAGNOSIS — I2510 Atherosclerotic heart disease of native coronary artery without angina pectoris: Secondary | ICD-10-CM

## 2019-01-17 HISTORY — DX: Atherosclerotic heart disease of native coronary artery without angina pectoris: I25.10

## 2019-01-17 NOTE — Patient Instructions (Signed)
Medication Instructions:  Your physician recommends that you continue on your current medications as directed. Please refer to the Current Medication list given to you today.  If you need a refill on your cardiac medications before your next appointment, please call your pharmacy.   Lab work: None If you have labs (blood work) drawn today and your tests are completely normal, you will receive your results only by: Marland Kitchen MyChart Message (if you have MyChart) OR . A paper copy in the mail If you have any lab test that is abnormal or we need to change your treatment, we will call you to review the results.  Testing/Procedures: None  Follow-Up: At [facility], you and your health needs are our priority.  As part of our continuing mission to provide you with exceptional heart care, we have created designated Provider Care Teams.  These Care Teams include your primary Cardiologist (physician) and Advanced Practice Providers (APPs -  Physician Assistants and Nurse Practitioners) who all work together to provide you with the care you need, when you need it. You will need a follow up appointment in 6 months.   Any Other Special Instructions Will Be Listed Below (If Applicable).

## 2019-02-06 ENCOUNTER — Ambulatory Visit: Payer: Medicare Other | Admitting: Neurology

## 2019-02-08 DIAGNOSIS — R142 Eructation: Secondary | ICD-10-CM | POA: Diagnosis not present

## 2019-02-08 DIAGNOSIS — R232 Flushing: Secondary | ICD-10-CM | POA: Diagnosis not present

## 2019-02-16 DIAGNOSIS — F419 Anxiety disorder, unspecified: Secondary | ICD-10-CM | POA: Diagnosis not present

## 2019-02-16 DIAGNOSIS — K219 Gastro-esophageal reflux disease without esophagitis: Secondary | ICD-10-CM | POA: Diagnosis not present

## 2019-02-16 DIAGNOSIS — R0789 Other chest pain: Secondary | ICD-10-CM | POA: Diagnosis not present

## 2019-02-22 ENCOUNTER — Ambulatory Visit (INDEPENDENT_AMBULATORY_CARE_PROVIDER_SITE_OTHER): Payer: Medicare Other | Admitting: Cardiology

## 2019-02-22 ENCOUNTER — Other Ambulatory Visit: Payer: Self-pay

## 2019-02-22 VITALS — BP 162/80 | HR 80 | Temp 99.7°F | Ht 61.0 in | Wt 138.8 lb

## 2019-02-22 DIAGNOSIS — I480 Paroxysmal atrial fibrillation: Secondary | ICD-10-CM | POA: Diagnosis not present

## 2019-02-22 DIAGNOSIS — Z7901 Long term (current) use of anticoagulants: Secondary | ICD-10-CM | POA: Diagnosis not present

## 2019-02-22 DIAGNOSIS — R9431 Abnormal electrocardiogram [ECG] [EKG]: Secondary | ICD-10-CM

## 2019-02-22 DIAGNOSIS — I10 Essential (primary) hypertension: Secondary | ICD-10-CM | POA: Diagnosis not present

## 2019-02-22 DIAGNOSIS — R079 Chest pain, unspecified: Secondary | ICD-10-CM | POA: Diagnosis not present

## 2019-02-22 DIAGNOSIS — I484 Atypical atrial flutter: Secondary | ICD-10-CM

## 2019-02-22 MED ORDER — NITROGLYCERIN 0.4 MG SL SUBL: 0.4000 mg | SUBLINGUAL_TABLET | SUBLINGUAL | 1 refills | Status: DC | PRN

## 2019-02-22 NOTE — Progress Notes (Signed)
Cardiology Office Note:    Date:  02/22/2019   ID:  [name], DOB February 01, 1941, MRN [id number]  PCP:  Sydney Dress, MD  Cardiologist:  Sydney More, MD    Referring MD: Sydney Dress, MD    ASSESSMENT:    1. Chest pain in adult   2. Abnormal EKG   3. Mild CAD   4. Paroxysmal atrial fibrillation (HCC)   5. Atypical atrial flutter (HCC)   6. Chronic anticoagulation   7. Essential hypertension    PLAN:    In order of problems listed above:  1. Chest pain -patient has had one episode of nonexertional chest burning.  About a week ago she has had no other symptoms no exertional chest pain EKG is unchanged from baseline we talked about repeat ischemia evaluation she does not want to do that at this time and I asked her to continue her rate limiting calcium channel blocker and artery with a prescription for nitroglycerin if this is recurrent we will need to consider the merits of an ischemia evaluation likely cardiac CTA. 2. Abnormal EKG -unchanged LVH repolarization 3. Atrial arrthymia including atrial flutter, PAF - s/p multiple EP interventions at this time maintaining sinus rhythm with diltiazem continue anticoagulant.  4. Chronic anticoagulation - Secondary to history of atrial flutter and paroxysmal atrial fibrillation. Hb by PCP stable 11/2018.  Continue anticoagulant 5. HTN -stable home blood pressures generally in range continue current treatment 6. HLD - Statin intolerant. Previously declined PCSK9.  With a controlled   Next appointment: 6 months   Medication Adjustments/Labs and Tests Ordered: Current medicines are reviewed at length with the patient today.  Concerns regarding medicines are outlined above.  Orders Placed This Encounter  Procedures  . EKG 12-Lead   Meds ordered this encounter  Medications  . nitroGLYCERIN (NITROSTAT) 0.4 MG SL tablet    Sig: Place 1 tablet (0.4 mg total) under the tongue every 5 (five) minutes as needed for chest pain.   Dispense:  25 tablet    Refill:  1    Chief complaint: 78 yo female presents for evaluation of chest pain and abnormal EKG at the request of her PCP.   History of Present Illness:    [name] is a 78 y.o. female with a hx of PAF, SVT, atrial flutter on chronic anticoagulation, mild CAD, bilateral carotid atherosclerosis, HTN, HLD,hypothyroidism last seen 01/17/19. She has had multiple EP interventions including PV ablation in 2010 and 2012. She is intolerant of statins and has declined PCSK9 therapy. Of note, she has had intolerances to multiple medications detailed under "allergies". She is seen today at the request of her PCP for chest pain and abnormal EKG. Her last stress test was in 2018.   Compliance with diet, lifestyle and medications: Yes  Recently she has been under a great deal of stress thinks anxiety plays a role she has had diffuse body burning diaphoresis and went back on hormone replacement therapy.  He has had no recurrent atrial arrhythmia edema shortness of breath angina palpitation or syncope but one evening she noticed burning diffusely through chest back and both arms.  It lasted less than 15 to 20 minutes and resolved and has not recurred this was about a week ago EKG with her PCP showed the same pattern of LVH repolarization Past Medical History:  Diagnosis Date  .  Possible Neuropathy 04/16/2015  . Adverse effect of drug 04/16/2015  . Anesthesia to pain 03/02/2017   Overview:  reaction  to Protamine with 2nd cardiac ablation.  . Anticoagulant long-term use    ELIQUIS  . Anticoagulated 06/20/2016  . Anxiety 12/05/2015  . Atrial fibrillation ([place]) 01/10/2014  . Atrial flutter by electrocardiogram ([place]) 06/06/2014  . Atrial flutter, paroxysmal ([place])   . Atypical atrial flutter ([place]) 10/21/2016  . Chronic anticoagulation 02/13/2015  . Congenital anomaly of pulmonary vein    per Cardiac MRI done at [facility] 02-16-2014-- noted 5 pulmonary veins (3 on the right and 2 on the left )  to left atrium  . Congestive heart failure ([place]) 01/10/2014  . Current use of long term anticoagulation 02/13/2015  . Drug reaction to Amoxicillin 04/16/2015  . Eczema 04/16/2015  . Encounter for monitoring sotalol therapy 06/06/2014  . Encounter for therapeutic drug level monitoring 06/06/2014  . Esophageal reflux 01/10/2014  . First degree heart block   . Gastroesophageal reflux disease 01/10/2014  . GERD (gastroesophageal reflux disease)   . High risk medication use 02/13/2015   Overview:  Ticosyn  . History of amiodarone therapy 06/20/2016  . History of diverticulitis 06/25/2016  . History of diverticulitis of colon    04/ 2017  . Hyperlipidemia   . Hypertension   . Hyponatremia 05/17/2016  . Hypothyroid 12/05/2015  . Hypothyroidism   . Hypothyroidism (acquired) 01/10/2014  . Long term current use of antiarrhythmic drug 10/15/2016  . On amiodarone therapy 03/23/2016  . Ovarian cyst 12/05/2015  . PAF (paroxysmal atrial fibrillation) [facility]) cardiologist- [name])  . Paroxysmal atrial fibrillation ([place]) 02/13/2015   Overview:  had pulmonary vein ablation for AF in 2010 and redo for recurrence in April 2012  * CHADS2 score=2  Overview:  had pulmonary vein ablation for AF in 2010 and redo for recurrence in April 2012  * CHADS2 score=2  . PAT (paroxysmal atrial tachycardia) ([place])   . Rectocele 12/05/2015  . S/P ablation of atrial fibrillation 2011  &  2012  &  02-19-2014 at [place] cardiologist-- [name] ([place])  . Systolic CHF ([place])   . Urethral stenosis   . Urethral stricture   . Ventral hernia 05/17/2016  . Wears glasses     Past Surgical History:  Procedure Laterality Date  . ABDOMINAL HERNIA REPAIR  10/ 2017   at [facility]  and 1989  . ABDOMINAL HYSTERECTOMY  1986  . ANTERIOR REPAIR AND TRANSOBTURATOR SLING  09-08-2010   CYSTOCELE AND SUI  . CARDIAC CATHETERIZATION  1991 ([facility]) &  2006 ([name])   lvf preserved/ 45-50% mid rca/  40% lad after takeoff  diagonal branch  . CARDIAC ELECTROPHYSIOLOGY STUDY AND ABLATION  x3 2011  &  2012  ([facility]) and 02-19-2014 at [place]   per EP study report 02-19-2014-- re-do PVI (LIPV & RSPV re-isolated) left atrial roof line & CTI line performed  . CARDIOVASCULAR STRESS TEST  04-08-2009     normal study/ ef 65%  . CARDIOVERSION  last one 09-01-2016  in [place]  . CYSTOSCOPY WITH URETHRAL DILATATION N/A 12/23/2012   Procedure: CYSTOSCOPY WITH BALLOON DILATATION;  Surgeon: Claybon Jabs, MD;  Location: [facility];  Service: Urology;  Laterality: N/A;  . TRANSESOPHAGEAL ECHOCARDIOGRAM  05-20-2009   normal lvf and structure/ ef 60-65%/ no thrombus  . TUBAL LIGATION  age 60    Current Medications: No outpatient medications have been marked as taking for the 02/22/19 encounter (Office Visit) with Richardo Priest, MD.     Allergies:   Amiodarone,  Protamine, Amoxicillin, Celebrex [celecoxib], Clindamycin, Lisinopril, Ace inhibitors, Cleocin [clindamycin hcl], Diflucan [fluconazole], Pravastatin, and Simvastatin   Social History   Socioeconomic History  . Marital status: Widowed    Spouse name: Not on file  . Number of children: 4  . Years of education: Not on file  . Highest education level: Some college, no degree  Occupational History  . Occupation: retired  Scientific laboratory technician  . Financial resource strain: Not on file  . Food insecurity    Worry: Not on file    Inability: Not on file  . Transportation needs    Medical: Not on file    Non-medical: Not on file  Tobacco Use  . Smoking status: Former Smoker    Packs/day: 0.75    Years: 30.00    Pack years: 22.50    Types: Cigarettes    Quit date: 12/21/2004    Years since quitting: 14.1  . Smokeless tobacco: Never Used  Substance and Sexual Activity  . Alcohol use: No  . Drug use: No  . Sexual activity: Not on file  Lifestyle  . Physical activity    Days per week: Not on file    Minutes per session: Not on file  .  Stress: Not on file  Relationships  . Social Herbalist on phone: Not on file    Gets together: Not on file    Attends religious service: Not on file    Active member of club or organization: Not on file    Attends meetings of clubs or organizations: Not on file    Relationship status: Not on file  Other Topics Concern  . Not on file  Social History Narrative   Patient is right-handed. She lives in a one level home. She drinks two 8 oz glasses of tea a day, an occasional soda and rarely coffee.      Family History: The patient's family history includes Diabetes in her maternal grandfather and son; Heart attack in her father and sister; Liver cancer in her maternal grandfather and maternal grandmother; Stroke in her mother and paternal grandmother.   ROS:   Please see the history of present illness.    All other systems reviewed and are negative.  EKGs/Labs/Other Studies Reviewed:    The following studies were reviewed today:  04/21/2018 Carotid Duplex:  Final Interpretation:  Right Carotid: There is no evidence of stenosis in the right ICA.  Left Carotid: There is no evidence of stenosis in the left ICA.  Vertebrals:  Bilateral vertebral arteries demonstrate antegrade flow.  Subclavians: Normal flow hemodynamics were seen in bilateral subclavian arteries.  03/2017 Stress Test:  Nuclear stress EF: 75%.  No T wave inversion was noted during stress.  There was no ST segment deviation noted during stress.  Defect 1: There is a small defect of moderate severity.  This is a low risk study. Small size, moderate intensity fixed anteroapical attenuation artifact. No significant reversible ischemia. LVEF 75% with normal wall motion. This is a low risk study.  EKG:  EKG ordered today and personally reviewed.  The ekg ordered today demonstrates similar morphology LVH repolarization unchanged EKG 02/16/2019 [place] LVH and marked repolarization unchanged from Nov 2019  Recent Labs: 11/29/18 [facility]:  ALT 14, AST 17, K 4.8, creatinine 0.9, GFR 61,  Hb 13.5  TSH 4.7 T4 9.6 T3 uptake 23%  Recent Lipid Panel 11/29/18 via [facility]: Total 267, HDL 63, LDL 163, Triglycerides 206  Physical Exam:  VS:  BP (!) 162/80 (BP Location: Right Arm, Patient Position: Sitting, Cuff Size: Normal)   Pulse 80   Temp 99.7 F (37.6 C)   Ht 5\' 1"  (1.549 m)   Wt 138 lb 12.8 oz (63 kg)   SpO2 98%   BMI 26.23 kg/m     Wt Readings from Last 3 Encounters:  02/22/19 138 lb 12.8 oz (63 kg)  01/17/19 137 lb 6.4 oz (62.3 kg)  10/03/18 140 lb (63.5 kg)     GEN:  Well nourished, well developed in no acute distress HEENT: Normal NECK: No JVD; No carotid bruits LYMPHATICS: No lymphadenopathy CARDIAC: RRR, no murmurs, rubs, gallops RESPIRATORY:  Clear to auscultation without rales, wheezing or rhonchi  ABDOMEN: Soft, non-tender, non-distended MUSCULOSKELETAL:  No edema; No deformity  SKIN: Warm and dry NEUROLOGIC:  Robertson and oriented x 3 PSYCHIATRIC:  Normal affect    Signed, Sydney More, MD  02/22/2019 3:21 PM    [facility]

## 2019-02-22 NOTE — Patient Instructions (Signed)
Medication Instructions:   Nitroglycerin 0.4 mg sublingual (under your tongue) as needed for chest pain. If experiencing chest pain, stop what you are doing and sit down. Take 1 nitroglycerin and wait 5 minutes. If chest pain continues, take another nitroglycerin and wait 5 minutes. If chest pain does not subside, take 1 more nitroglycerin and dial 911. You make take a total of 3 nitroglycerin in a 15 minute time frame.    If you need a refill on your cardiac medications before your next appointment, please call your pharmacy.   Lab work: None If you have labs (blood work) drawn today and your tests are completely normal, you will receive your results only by: Marland Kitchen MyChart Message (if you have MyChart) OR . A paper copy in the mail If you have any lab test that is abnormal or we need to change your treatment, we will call you to review the results.  Testing/Procedures: None  Follow-Up: At [facility], you and your health needs are our priority.  As part of our continuing mission to provide you with exceptional heart care, we have created designated Provider Care Teams.  These Care Teams include your primary Cardiologist (physician) and Advanced Practice Providers (APPs -  Physician Assistants and Nurse Practitioners) who all work together to provide you with the care you need, when you need it. You will need a follow up appointment in 6 months.   Any Other Special Instructions Will Be Listed Below (If Applicable).

## 2019-02-23 ENCOUNTER — Ambulatory Visit: Payer: Medicare Other | Admitting: Cardiology

## 2019-02-27 DIAGNOSIS — K5900 Constipation, unspecified: Secondary | ICD-10-CM | POA: Diagnosis not present

## 2019-02-27 DIAGNOSIS — K219 Gastro-esophageal reflux disease without esophagitis: Secondary | ICD-10-CM | POA: Diagnosis not present

## 2019-02-27 DIAGNOSIS — K5730 Diverticulosis of large intestine without perforation or abscess without bleeding: Secondary | ICD-10-CM | POA: Diagnosis not present

## 2019-02-28 DIAGNOSIS — E039 Hypothyroidism, unspecified: Secondary | ICD-10-CM | POA: Diagnosis not present

## 2019-02-28 DIAGNOSIS — I10 Essential (primary) hypertension: Secondary | ICD-10-CM | POA: Diagnosis not present

## 2019-02-28 DIAGNOSIS — Z139 Encounter for screening, unspecified: Secondary | ICD-10-CM | POA: Diagnosis not present

## 2019-02-28 DIAGNOSIS — E785 Hyperlipidemia, unspecified: Secondary | ICD-10-CM | POA: Diagnosis not present

## 2019-02-28 DIAGNOSIS — Z79899 Other long term (current) drug therapy: Secondary | ICD-10-CM | POA: Diagnosis not present

## 2019-02-28 DIAGNOSIS — K219 Gastro-esophageal reflux disease without esophagitis: Secondary | ICD-10-CM | POA: Diagnosis not present

## 2019-04-10 DIAGNOSIS — K5730 Diverticulosis of large intestine without perforation or abscess without bleeding: Secondary | ICD-10-CM | POA: Diagnosis not present

## 2019-04-10 DIAGNOSIS — R1032 Left lower quadrant pain: Secondary | ICD-10-CM | POA: Diagnosis not present

## 2019-04-10 DIAGNOSIS — K219 Gastro-esophageal reflux disease without esophagitis: Secondary | ICD-10-CM | POA: Diagnosis not present

## 2019-05-31 DIAGNOSIS — I10 Essential (primary) hypertension: Secondary | ICD-10-CM | POA: Diagnosis not present

## 2019-05-31 DIAGNOSIS — E785 Hyperlipidemia, unspecified: Secondary | ICD-10-CM | POA: Diagnosis not present

## 2019-05-31 DIAGNOSIS — E039 Hypothyroidism, unspecified: Secondary | ICD-10-CM | POA: Diagnosis not present

## 2019-05-31 DIAGNOSIS — K219 Gastro-esophageal reflux disease without esophagitis: Secondary | ICD-10-CM | POA: Diagnosis not present

## 2019-05-31 DIAGNOSIS — Z79899 Other long term (current) drug therapy: Secondary | ICD-10-CM | POA: Diagnosis not present

## 2019-06-15 DIAGNOSIS — Z1231 Encounter for screening mammogram for malignant neoplasm of breast: Secondary | ICD-10-CM | POA: Diagnosis not present

## 2019-08-29 DIAGNOSIS — H40013 Open angle with borderline findings, low risk, bilateral: Secondary | ICD-10-CM | POA: Diagnosis not present

## 2019-08-29 DIAGNOSIS — H43393 Other vitreous opacities, bilateral: Secondary | ICD-10-CM | POA: Diagnosis not present

## 2019-08-29 DIAGNOSIS — H5203 Hypermetropia, bilateral: Secondary | ICD-10-CM | POA: Diagnosis not present

## 2019-08-29 DIAGNOSIS — H2513 Age-related nuclear cataract, bilateral: Secondary | ICD-10-CM | POA: Diagnosis not present

## 2019-08-30 DIAGNOSIS — H2511 Age-related nuclear cataract, right eye: Secondary | ICD-10-CM | POA: Diagnosis not present

## 2019-08-30 DIAGNOSIS — Z01818 Encounter for other preprocedural examination: Secondary | ICD-10-CM | POA: Diagnosis not present

## 2019-08-31 DIAGNOSIS — H25811 Combined forms of age-related cataract, right eye: Secondary | ICD-10-CM | POA: Diagnosis not present

## 2019-08-31 DIAGNOSIS — H2511 Age-related nuclear cataract, right eye: Secondary | ICD-10-CM | POA: Diagnosis not present

## 2019-09-05 DIAGNOSIS — I10 Essential (primary) hypertension: Secondary | ICD-10-CM | POA: Diagnosis not present

## 2019-09-05 DIAGNOSIS — E785 Hyperlipidemia, unspecified: Secondary | ICD-10-CM | POA: Diagnosis not present

## 2019-09-05 DIAGNOSIS — K219 Gastro-esophageal reflux disease without esophagitis: Secondary | ICD-10-CM | POA: Diagnosis not present

## 2019-09-05 DIAGNOSIS — Z79899 Other long term (current) drug therapy: Secondary | ICD-10-CM | POA: Diagnosis not present

## 2019-09-05 DIAGNOSIS — E039 Hypothyroidism, unspecified: Secondary | ICD-10-CM | POA: Diagnosis not present

## 2019-09-05 DIAGNOSIS — Z9181 History of falling: Secondary | ICD-10-CM | POA: Diagnosis not present

## 2019-09-06 DIAGNOSIS — H2511 Age-related nuclear cataract, right eye: Secondary | ICD-10-CM | POA: Diagnosis not present

## 2019-09-06 DIAGNOSIS — H52202 Unspecified astigmatism, left eye: Secondary | ICD-10-CM | POA: Diagnosis not present

## 2019-09-06 DIAGNOSIS — H25812 Combined forms of age-related cataract, left eye: Secondary | ICD-10-CM | POA: Diagnosis not present

## 2019-09-06 DIAGNOSIS — H2512 Age-related nuclear cataract, left eye: Secondary | ICD-10-CM | POA: Diagnosis not present

## 2019-09-25 ENCOUNTER — Ambulatory Visit: Payer: Medicare Other

## 2019-10-16 DIAGNOSIS — R110 Nausea: Secondary | ICD-10-CM | POA: Diagnosis not present

## 2019-10-16 DIAGNOSIS — M19071 Primary osteoarthritis, right ankle and foot: Secondary | ICD-10-CM | POA: Diagnosis not present

## 2019-11-02 DIAGNOSIS — E039 Hypothyroidism, unspecified: Secondary | ICD-10-CM | POA: Diagnosis not present

## 2019-11-02 DIAGNOSIS — I4891 Unspecified atrial fibrillation: Secondary | ICD-10-CM | POA: Diagnosis not present

## 2019-11-02 DIAGNOSIS — I10 Essential (primary) hypertension: Secondary | ICD-10-CM | POA: Diagnosis not present

## 2019-12-04 DIAGNOSIS — E039 Hypothyroidism, unspecified: Secondary | ICD-10-CM | POA: Diagnosis not present

## 2019-12-04 DIAGNOSIS — E785 Hyperlipidemia, unspecified: Secondary | ICD-10-CM | POA: Diagnosis not present

## 2019-12-04 DIAGNOSIS — I10 Essential (primary) hypertension: Secondary | ICD-10-CM | POA: Diagnosis not present

## 2019-12-04 DIAGNOSIS — K219 Gastro-esophageal reflux disease without esophagitis: Secondary | ICD-10-CM | POA: Diagnosis not present

## 2019-12-04 DIAGNOSIS — E871 Hypo-osmolality and hyponatremia: Secondary | ICD-10-CM | POA: Diagnosis not present

## 2019-12-04 DIAGNOSIS — J301 Allergic rhinitis due to pollen: Secondary | ICD-10-CM | POA: Diagnosis not present

## 2019-12-07 DIAGNOSIS — H1013 Acute atopic conjunctivitis, bilateral: Secondary | ICD-10-CM | POA: Diagnosis not present

## 2020-01-19 DIAGNOSIS — N390 Urinary tract infection, site not specified: Secondary | ICD-10-CM | POA: Diagnosis not present

## 2020-01-19 DIAGNOSIS — R351 Nocturia: Secondary | ICD-10-CM | POA: Diagnosis not present

## 2020-01-19 DIAGNOSIS — R350 Frequency of micturition: Secondary | ICD-10-CM | POA: Diagnosis not present

## 2020-03-05 DIAGNOSIS — Z139 Encounter for screening, unspecified: Secondary | ICD-10-CM | POA: Diagnosis not present

## 2020-03-05 DIAGNOSIS — E039 Hypothyroidism, unspecified: Secondary | ICD-10-CM | POA: Diagnosis not present

## 2020-03-05 DIAGNOSIS — Z1231 Encounter for screening mammogram for malignant neoplasm of breast: Secondary | ICD-10-CM | POA: Diagnosis not present

## 2020-03-05 DIAGNOSIS — I4891 Unspecified atrial fibrillation: Secondary | ICD-10-CM | POA: Diagnosis not present

## 2020-03-05 DIAGNOSIS — Z79899 Other long term (current) drug therapy: Secondary | ICD-10-CM | POA: Diagnosis not present

## 2020-03-05 DIAGNOSIS — E785 Hyperlipidemia, unspecified: Secondary | ICD-10-CM | POA: Diagnosis not present

## 2020-03-05 DIAGNOSIS — K219 Gastro-esophageal reflux disease without esophagitis: Secondary | ICD-10-CM | POA: Diagnosis not present

## 2020-03-05 DIAGNOSIS — J301 Allergic rhinitis due to pollen: Secondary | ICD-10-CM | POA: Diagnosis not present

## 2020-03-05 DIAGNOSIS — F419 Anxiety disorder, unspecified: Secondary | ICD-10-CM | POA: Diagnosis not present

## 2020-03-05 DIAGNOSIS — E871 Hypo-osmolality and hyponatremia: Secondary | ICD-10-CM | POA: Diagnosis not present

## 2020-03-05 DIAGNOSIS — I10 Essential (primary) hypertension: Secondary | ICD-10-CM | POA: Diagnosis not present

## 2020-03-13 DIAGNOSIS — R8290 Unspecified abnormal findings in urine: Secondary | ICD-10-CM | POA: Diagnosis not present

## 2020-03-13 DIAGNOSIS — N35028 Other post-traumatic urethral stricture, female: Secondary | ICD-10-CM | POA: Diagnosis not present

## 2020-03-13 DIAGNOSIS — I4891 Unspecified atrial fibrillation: Secondary | ICD-10-CM | POA: Diagnosis not present

## 2020-03-17 DIAGNOSIS — N3000 Acute cystitis without hematuria: Secondary | ICD-10-CM | POA: Diagnosis not present

## 2020-03-17 DIAGNOSIS — B9689 Other specified bacterial agents as the cause of diseases classified elsewhere: Secondary | ICD-10-CM | POA: Diagnosis not present

## 2020-03-28 DIAGNOSIS — N3592 Unspecified urethral stricture, female: Secondary | ICD-10-CM | POA: Diagnosis not present

## 2020-03-28 DIAGNOSIS — N35028 Other post-traumatic urethral stricture, female: Secondary | ICD-10-CM | POA: Diagnosis not present

## 2020-04-29 DIAGNOSIS — L820 Inflamed seborrheic keratosis: Secondary | ICD-10-CM | POA: Diagnosis not present

## 2020-04-29 DIAGNOSIS — L578 Other skin changes due to chronic exposure to nonionizing radiation: Secondary | ICD-10-CM | POA: Diagnosis not present

## 2020-04-29 DIAGNOSIS — L821 Other seborrheic keratosis: Secondary | ICD-10-CM | POA: Diagnosis not present

## 2020-05-08 DIAGNOSIS — R82998 Other abnormal findings in urine: Secondary | ICD-10-CM | POA: Diagnosis not present

## 2020-06-07 DIAGNOSIS — E871 Hypo-osmolality and hyponatremia: Secondary | ICD-10-CM | POA: Diagnosis not present

## 2020-06-07 DIAGNOSIS — Z1231 Encounter for screening mammogram for malignant neoplasm of breast: Secondary | ICD-10-CM | POA: Diagnosis not present

## 2020-06-07 DIAGNOSIS — Z1331 Encounter for screening for depression: Secondary | ICD-10-CM | POA: Diagnosis not present

## 2020-06-07 DIAGNOSIS — Z79899 Other long term (current) drug therapy: Secondary | ICD-10-CM | POA: Diagnosis not present

## 2020-06-07 DIAGNOSIS — J301 Allergic rhinitis due to pollen: Secondary | ICD-10-CM | POA: Diagnosis not present

## 2020-06-07 DIAGNOSIS — E785 Hyperlipidemia, unspecified: Secondary | ICD-10-CM | POA: Diagnosis not present

## 2020-06-07 DIAGNOSIS — E039 Hypothyroidism, unspecified: Secondary | ICD-10-CM | POA: Diagnosis not present

## 2020-06-07 DIAGNOSIS — K219 Gastro-esophageal reflux disease without esophagitis: Secondary | ICD-10-CM | POA: Diagnosis not present

## 2020-06-07 DIAGNOSIS — F419 Anxiety disorder, unspecified: Secondary | ICD-10-CM | POA: Diagnosis not present

## 2020-06-07 DIAGNOSIS — I10 Essential (primary) hypertension: Secondary | ICD-10-CM | POA: Diagnosis not present

## 2020-06-07 DIAGNOSIS — I4891 Unspecified atrial fibrillation: Secondary | ICD-10-CM | POA: Diagnosis not present

## 2020-06-12 DIAGNOSIS — Z0000 Encounter for general adult medical examination without abnormal findings: Secondary | ICD-10-CM | POA: Diagnosis not present

## 2020-06-12 DIAGNOSIS — Z1331 Encounter for screening for depression: Secondary | ICD-10-CM | POA: Diagnosis not present

## 2020-06-12 DIAGNOSIS — Z9181 History of falling: Secondary | ICD-10-CM | POA: Diagnosis not present

## 2020-06-12 DIAGNOSIS — E785 Hyperlipidemia, unspecified: Secondary | ICD-10-CM | POA: Diagnosis not present

## 2020-06-13 DIAGNOSIS — N3592 Unspecified urethral stricture, female: Secondary | ICD-10-CM | POA: Diagnosis not present

## 2020-06-17 DIAGNOSIS — J019 Acute sinusitis, unspecified: Secondary | ICD-10-CM | POA: Diagnosis not present

## 2020-07-09 DIAGNOSIS — K5904 Chronic idiopathic constipation: Secondary | ICD-10-CM | POA: Diagnosis not present

## 2020-07-09 DIAGNOSIS — K648 Other hemorrhoids: Secondary | ICD-10-CM | POA: Diagnosis not present

## 2020-07-11 DIAGNOSIS — N3592 Unspecified urethral stricture, female: Secondary | ICD-10-CM | POA: Diagnosis not present

## 2020-07-19 DIAGNOSIS — R040 Epistaxis: Secondary | ICD-10-CM | POA: Diagnosis not present

## 2020-08-14 DIAGNOSIS — M19071 Primary osteoarthritis, right ankle and foot: Secondary | ICD-10-CM | POA: Diagnosis not present

## 2020-08-16 DIAGNOSIS — M7989 Other specified soft tissue disorders: Secondary | ICD-10-CM | POA: Diagnosis not present

## 2020-08-16 DIAGNOSIS — M19071 Primary osteoarthritis, right ankle and foot: Secondary | ICD-10-CM | POA: Diagnosis not present

## 2020-08-16 DIAGNOSIS — M2011 Hallux valgus (acquired), right foot: Secondary | ICD-10-CM | POA: Diagnosis not present

## 2020-08-20 DIAGNOSIS — K5904 Chronic idiopathic constipation: Secondary | ICD-10-CM | POA: Diagnosis not present

## 2020-08-27 DIAGNOSIS — Z7901 Long term (current) use of anticoagulants: Secondary | ICD-10-CM | POA: Insufficient documentation

## 2020-08-27 DIAGNOSIS — Q268 Other congenital malformations of great veins: Secondary | ICD-10-CM | POA: Insufficient documentation

## 2020-08-27 DIAGNOSIS — E039 Hypothyroidism, unspecified: Secondary | ICD-10-CM | POA: Insufficient documentation

## 2020-08-27 DIAGNOSIS — Z8679 Personal history of other diseases of the circulatory system: Secondary | ICD-10-CM | POA: Insufficient documentation

## 2020-08-27 DIAGNOSIS — I4892 Unspecified atrial flutter: Secondary | ICD-10-CM | POA: Insufficient documentation

## 2020-08-27 DIAGNOSIS — Z8719 Personal history of other diseases of the digestive system: Secondary | ICD-10-CM | POA: Insufficient documentation

## 2020-08-27 DIAGNOSIS — I5020 Unspecified systolic (congestive) heart failure: Secondary | ICD-10-CM | POA: Insufficient documentation

## 2020-08-27 DIAGNOSIS — K219 Gastro-esophageal reflux disease without esophagitis: Secondary | ICD-10-CM | POA: Insufficient documentation

## 2020-08-27 DIAGNOSIS — Z9889 Other specified postprocedural states: Secondary | ICD-10-CM | POA: Insufficient documentation

## 2020-08-27 DIAGNOSIS — I480 Paroxysmal atrial fibrillation: Secondary | ICD-10-CM | POA: Insufficient documentation

## 2020-08-27 DIAGNOSIS — Z973 Presence of spectacles and contact lenses: Secondary | ICD-10-CM | POA: Insufficient documentation

## 2020-08-27 DIAGNOSIS — I440 Atrioventricular block, first degree: Secondary | ICD-10-CM | POA: Insufficient documentation

## 2020-09-05 NOTE — Progress Notes (Signed)
Cardiology Office Note:    Date:  09/06/2020   ID:  [name], DOB October 19, 1940, MRN [id number]  PCP:  Sydney Dress, MD  Cardiologist:  [name] , MD    Referring MD: Sydney Dress, MD    ASSESSMENT:    1. Paroxysmal atrial fibrillation (HCC)   2. Essential hypertension   3. Chronic anticoagulation    PLAN:    In order of problems listed above:  1. Has returned to sinus rhythm.  She has had a good long-term durable result from her last EP procedures without further episodes of atrial fibrillation or chest pain.  Will continue Eliquis 5 mg BID.  May need to decrease dose at next visit with ABC dose for stroke prophylaxis in elderly women. 2. Blood pressure slightly elevated today. Will start on HCTZ 12.5 mg daily. She will follow up with her PCP in a couple of weeks for labs. 3. She will continue her anticoagulant moderate stroke risk 4. Optimize antihypertensive therapy adding low-dose thiazide diuretic with arrangements for labs next week with her primary care physician   Next appointment: 1 year   Medication Adjustments/Labs and Tests Ordered: Current medicines are reviewed at length with the patient today.  Concerns regarding medicines are outlined above.  Orders Placed This Encounter  Procedures  . EKG 12-Lead   Meds ordered this encounter  Medications  . hydrochlorothiazide (MICROZIDE) 12.5 MG capsule    Sig: Take 1 capsule (12.5 mg total) by mouth daily.    Dispense:  90 capsule    Refill:  3    Chief Complaint  Patient presents with  . Follow-up  . Atrial Fibrillation    History of Present Illness:    [name] is a 80 y.o. female with a hx of paroxysmal atrial fibrillation atrial flutter and SVT chronically anticoagulated, hypertension hyperlipidemia mild CAD and hypothyroidism.  She has had multiple EP procedures including pulmonary vein ablation 2010 and 2012.  She was last seen 02/22/2019 with chest pain and declined an ischemia  evaluation.  Compliance with diet, lifestyle and medications: yes   [name] reports she is doing well.  She denies any chest pain, palpitations or shortness of breath. She does endorse some bilateral lower extremity edema that is sometime painful.  Otherwise she is doing well her edema is quite typical calcium channel blocker she is hesitant to stop diltiazem inquires of taking a low-dose diuretic I think it be helpful with her hypertension we will start hydrochlorothiazide and she has arrangements for lab work next week with her primary care physician.  She continues on Eliquis without bleeding complications.  Recent labs PCP 06/07/2020: CBC normal GFR normal potassium 3.9 Cholesterol 167 LDL 74 triglycerides 170 HDL 76 all in range at target  Past Medical History:  Diagnosis Date  .  Possible Neuropathy 04/16/2015  . Adverse effect of drug 04/16/2015  . Anesthesia to pain 03/02/2017   Overview:  reaction to Protamine with 2nd cardiac ablation.  . Anticoagulant long-term use    ELIQUIS  . Anticoagulated 06/20/2016  . Anxiety 12/05/2015  . Atrial fibrillation ([place]) 01/10/2014  . Atrial flutter by electrocardiogram ([place]) 06/06/2014  . Atrial flutter, paroxysmal ([place])   . Atypical atrial flutter ([place]) 10/21/2016  . Carotid atherosclerosis 04/21/2018  . Chronic anticoagulation 02/13/2015  . Congenital anomaly of pulmonary vein    per Cardiac MRI done at [facility] 02-16-2014-- noted 5 pulmonary veins (3 on the right and 2 on the left ) to left atrium  .  Congestive heart failure ([place]) 01/10/2014  . Current use of long term anticoagulation 02/13/2015  . Drug reaction to Amoxicillin 04/16/2015  . Eczema 04/16/2015  . Encounter for monitoring sotalol therapy 06/06/2014  . Encounter for therapeutic drug level monitoring 06/06/2014  . Esophageal reflux 01/10/2014  . First degree heart block   . Gastroesophageal reflux disease 01/10/2014  . GERD (gastroesophageal reflux disease)   . High risk medication use  02/13/2015   Overview:  Ticosyn  . History of amiodarone therapy 06/20/2016  . History of diverticulitis 06/25/2016  . History of diverticulitis of colon    04/ 2017  . Hyperlipidemia   . Hypertension   . Hyponatremia 05/17/2016  . Hypothyroid 12/05/2015  . Hypothyroidism   . Hypothyroidism (acquired) 01/10/2014  . Long term current use of antiarrhythmic drug 10/15/2016  . Mild CAD 01/17/2019  . On amiodarone therapy 03/23/2016  . Ovarian cyst 12/05/2015  . PAF (paroxysmal atrial fibrillation) [facility]) cardiologist- [name])  . Paroxysmal atrial fibrillation ([place]) 02/13/2015   Overview:  had pulmonary vein ablation for AF in 2010 and redo for recurrence in April 2012  * CHADS2 score=2  Overview:  had pulmonary vein ablation for AF in 2010 and redo for recurrence in April 2012  * CHADS2 score=2  . PAT (paroxysmal atrial tachycardia) ([place])   . Pre-operative cardiovascular examination 03/03/2017  . Rectocele 12/05/2015  . S/P ablation of atrial fibrillation 2011  &  2012  &  02-19-2014 at [place] cardiologist-- Sydney Norm Salt ([place])  . Systolic CHF ([place])   . Urethral stenosis   . Urethral stricture   . Ventral hernia 05/17/2016  . Wears glasses     Past Surgical History:  Procedure Laterality Date  . ABDOMINAL HERNIA REPAIR  10/ 2017   at [facility]  and 1989  . ABDOMINAL HYSTERECTOMY  1986  . ANTERIOR REPAIR AND TRANSOBTURATOR SLING  09-08-2010   CYSTOCELE AND SUI  . CARDIAC CATHETERIZATION  1991 ([place]) &  2006 ([name])   lvf preserved/ 45-50% mid rca/  40% lad after takeoff diagonal branch  . CARDIAC ELECTROPHYSIOLOGY STUDY AND ABLATION  x3 2011  &  2012  ([facility]) and 02-19-2014 at [place]   per EP study report 02-19-2014-- re-do PVI (LIPV & RSPV re-isolated) left atrial roof line & CTI line performed  . CARDIOVASCULAR STRESS TEST  04-08-2009     normal study/ ef 65%  . CARDIOVERSION  last one 09-01-2016  in [place]  . CYSTOSCOPY WITH URETHRAL  DILATATION N/A 12/23/2012   Procedure: CYSTOSCOPY WITH BALLOON DILATATION;  Surgeon: Sydney Jabs, MD;  Location: [facility];  Service: Urology;  Laterality: N/A;  . TRANSESOPHAGEAL ECHOCARDIOGRAM  05-20-2009   normal lvf and structure/ ef 60-65%/ no thrombus  . TUBAL LIGATION  age 89    Current Medications: Current Meds  Medication Sig  . acetaminophen (TYLENOL) 500 MG tablet Take by mouth every 6 (six) hours as needed.   . ALPRAZolam (XANAX) 0.25 MG tablet Take 0.25 mg by mouth 2 (two) times daily as needed.   . [employer] NASAL SPRAY 0.05 % nasal spray Place into both nostrils daily as needed.  . diltiazem (CARDIZEM CD) 180 MG 24 hr capsule Take 1 capsule (180 mg total) by mouth 2 (two) times daily. Takes at [place]  . ELIQUIS 5 MG TABS tablet Take 5 mg by mouth 2 (two) times daily. Pt aware to stop 3 days prior to [facility] 10-16-2016  .  hydrochlorothiazide (MICROZIDE) 12.5 MG capsule Take 1 capsule (12.5 mg total) by mouth daily.  Marland Kitchen levothyroxine (SYNTHROID, LEVOTHROID) 88 MCG tablet Take 88 mcg by mouth daily before breakfast.  . loratadine (CLARITIN) 10 MG tablet Take 10 mg by mouth as needed for allergies.  . magnesium hydroxide (MILK OF MAGNESIA) 400 MG/5ML suspension Take by mouth daily as needed for mild constipation.  . pantoprazole (PROTONIX) 40 MG tablet TK 1 T PO D-- takes in afternoon  . senna (SENOKOT) 8.6 MG tablet Take 1 tablet by mouth daily as needed for constipation.  . simvastatin (ZOCOR) 40 MG tablet Take 40 mg by mouth at bedtime.     Allergies:   Amiodarone, Protamine, Amoxicillin, Celebrex [celecoxib], Clindamycin, Lisinopril, Ace inhibitors, Cleocin [clindamycin hcl], Diflucan [fluconazole], Pravastatin, and Simvastatin   Social History   Socioeconomic History  . Marital status: Widowed    Spouse name: Not on file  . Number of children: 4  . Years of education: Not on file  . Highest education level: Some college, no degree  Occupational History  .  Occupation: retired  Tobacco Use  . Smoking status: Former Smoker    Packs/day: 0.75    Years: 30.00    Pack years: 22.50    Types: Cigarettes    Quit date: 12/21/2004    Years since quitting: 15.7  . Smokeless tobacco: Never Used  Vaping Use  . Vaping Use: Never used  Substance and Sexual Activity  . Alcohol use: No  . Drug use: No  . Sexual activity: Not on file  Other Topics Concern  . Not on file  Social History Narrative   Patient is right-handed. She lives in a one level home. She drinks two 8 oz glasses of tea a day, an occasional soda and rarely coffee.    Social Determinants of Health   Financial Resource Strain: Not on file  Food Insecurity: Not on file  Transportation Needs: Not on file  Physical Activity: Not on file  Stress: Not on file  Social Connections: Not on file     Family History: The patient's  family history includes Diabetes in her maternal grandfather and son; Heart attack in her father and sister; Liver cancer in her maternal grandfather and maternal grandmother; Stroke in her mother and paternal grandmother. ROS:   Please see the history of present illness.    All other systems reviewed and are negative.  EKGs/Labs/Other Studies Reviewed:    The following studies were reviewed today:  EKG:  EKG ordered today and personally reviewed.  The ekg ordered today demonstrates NSR LVH repolarization changes similar July 2020  Physical Exam:    VS:  BP (!) 150/60   Pulse 70   Ht 5\' 1"  (1.549 m)   Wt 139 lb (63 kg)   SpO2 98%   BMI 26.26 kg/m     Wt Readings from Last 3 Encounters:  09/06/20 139 lb (63 kg)  02/22/19 138 lb 12.8 oz (63 kg)  01/17/19 137 lb 6.4 oz (62.3 kg)     GEN:  Well nourished, well developed in no acute distress HEENT: Normal NECK: No JVD; No carotid bruits LYMPHATICS: No lymphadenopathy CARDIAC: RRR, no murmurs, rubs, gallops RESPIRATORY:  Clear to auscultation without rales, wheezing or rhonchi  ABDOMEN: Soft,  non-tender, non-distended MUSCULOSKELETAL:  No edema; No deformity  SKIN: Warm and dry NEUROLOGIC:  [place]on and oriented x 3 PSYCHIATRIC:  Normal affect    Signed, Shirlee More, MD  09/06/2020 10:14 AM  [facility]

## 2020-09-06 ENCOUNTER — Ambulatory Visit (INDEPENDENT_AMBULATORY_CARE_PROVIDER_SITE_OTHER): Payer: Medicare Other | Admitting: Cardiology

## 2020-09-06 ENCOUNTER — Other Ambulatory Visit: Payer: Self-pay

## 2020-09-06 ENCOUNTER — Encounter: Payer: Self-pay | Admitting: Cardiology

## 2020-09-06 VITALS — BP 150/60 | HR 70 | Ht 61.0 in | Wt 139.0 lb

## 2020-09-06 DIAGNOSIS — Z7901 Long term (current) use of anticoagulants: Secondary | ICD-10-CM | POA: Diagnosis not present

## 2020-09-06 DIAGNOSIS — I10 Essential (primary) hypertension: Secondary | ICD-10-CM

## 2020-09-06 DIAGNOSIS — I480 Paroxysmal atrial fibrillation: Secondary | ICD-10-CM | POA: Diagnosis not present

## 2020-09-06 MED ORDER — HYDROCHLOROTHIAZIDE 12.5 MG PO CAPS: 12.5000 mg | ORAL_CAPSULE | Freq: Every day | ORAL | 3 refills | Status: DC

## 2020-09-06 NOTE — Patient Instructions (Signed)
Medication Instructions:  Your physician has recommended you make the following change in your medication:  START: Hydrochlorothiazide 12.5 mg take one tablet by mouth daily.   *If you need a refill on your cardiac medications before your next appointment, please call your pharmacy*   Lab Work: None If you have labs (blood work) drawn today and your tests are completely normal, you will receive your results only by: [place] MyChart Message (if you have MyChart) OR . A paper copy in the mail If you have any lab test that is abnormal or we need to change your treatment, we will call you to review the results.   Testing/Procedures: None   Follow-Up: At [facility], you and your health needs are our priority.  As part of our continuing mission to provide you with exceptional heart care, we have created designated Provider Care Teams.  These Care Teams include your primary Cardiologist (physician) and Advanced Practice Providers (APPs -  Physician Assistants and Nurse Practitioners) who all work together to provide you with the care you need, when you need it.  We recommend signing up for the patient portal called "MyChart".  Sign up information is provided on this After Visit Summary.  MyChart is used to connect with patients for Virtual Visits (Telemedicine).  Patients are able to view lab/test results, encounter notes, upcoming appointments, etc.  Non-urgent messages can be sent to your provider as well.   To learn more about what you can do with MyChart, go to [email or web address].    Your next appointment:   1 year(s)  The format for your next appointment:   In Person  Provider:   Shirlee More, MD   Other Instructions

## 2020-09-10 DIAGNOSIS — E871 Hypo-osmolality and hyponatremia: Secondary | ICD-10-CM | POA: Diagnosis not present

## 2020-09-10 DIAGNOSIS — I10 Essential (primary) hypertension: Secondary | ICD-10-CM | POA: Diagnosis not present

## 2020-09-10 DIAGNOSIS — N3592 Unspecified urethral stricture, female: Secondary | ICD-10-CM | POA: Diagnosis not present

## 2020-09-10 DIAGNOSIS — E039 Hypothyroidism, unspecified: Secondary | ICD-10-CM | POA: Diagnosis not present

## 2020-09-10 DIAGNOSIS — F419 Anxiety disorder, unspecified: Secondary | ICD-10-CM | POA: Diagnosis not present

## 2020-09-10 DIAGNOSIS — I4891 Unspecified atrial fibrillation: Secondary | ICD-10-CM | POA: Diagnosis not present

## 2020-09-10 DIAGNOSIS — K219 Gastro-esophageal reflux disease without esophagitis: Secondary | ICD-10-CM | POA: Diagnosis not present

## 2020-09-10 DIAGNOSIS — E785 Hyperlipidemia, unspecified: Secondary | ICD-10-CM | POA: Diagnosis not present

## 2020-10-08 DIAGNOSIS — I4891 Unspecified atrial fibrillation: Secondary | ICD-10-CM | POA: Diagnosis not present

## 2020-10-08 DIAGNOSIS — E039 Hypothyroidism, unspecified: Secondary | ICD-10-CM | POA: Diagnosis not present

## 2020-10-08 DIAGNOSIS — F419 Anxiety disorder, unspecified: Secondary | ICD-10-CM | POA: Diagnosis not present

## 2020-10-08 DIAGNOSIS — N3592 Unspecified urethral stricture, female: Secondary | ICD-10-CM | POA: Diagnosis not present

## 2020-10-18 DIAGNOSIS — M159 Polyosteoarthritis, unspecified: Secondary | ICD-10-CM | POA: Diagnosis not present

## 2020-10-18 DIAGNOSIS — I10 Essential (primary) hypertension: Secondary | ICD-10-CM | POA: Diagnosis not present

## 2020-10-18 DIAGNOSIS — Z6826 Body mass index (BMI) 26.0-26.9, adult: Secondary | ICD-10-CM | POA: Diagnosis not present

## 2020-11-08 DIAGNOSIS — I10 Essential (primary) hypertension: Secondary | ICD-10-CM | POA: Diagnosis not present

## 2020-11-08 DIAGNOSIS — N3592 Unspecified urethral stricture, female: Secondary | ICD-10-CM | POA: Diagnosis not present

## 2020-11-08 DIAGNOSIS — Z1231 Encounter for screening mammogram for malignant neoplasm of breast: Secondary | ICD-10-CM | POA: Diagnosis not present

## 2020-12-09 DIAGNOSIS — R8290 Unspecified abnormal findings in urine: Secondary | ICD-10-CM | POA: Diagnosis not present

## 2020-12-09 DIAGNOSIS — N3592 Unspecified urethral stricture, female: Secondary | ICD-10-CM | POA: Diagnosis not present

## 2020-12-10 DIAGNOSIS — E871 Hypo-osmolality and hyponatremia: Secondary | ICD-10-CM | POA: Diagnosis not present

## 2020-12-10 DIAGNOSIS — Z1231 Encounter for screening mammogram for malignant neoplasm of breast: Secondary | ICD-10-CM | POA: Diagnosis not present

## 2020-12-10 DIAGNOSIS — I4891 Unspecified atrial fibrillation: Secondary | ICD-10-CM | POA: Diagnosis not present

## 2020-12-10 DIAGNOSIS — F419 Anxiety disorder, unspecified: Secondary | ICD-10-CM | POA: Diagnosis not present

## 2020-12-10 DIAGNOSIS — E785 Hyperlipidemia, unspecified: Secondary | ICD-10-CM | POA: Diagnosis not present

## 2020-12-10 DIAGNOSIS — E039 Hypothyroidism, unspecified: Secondary | ICD-10-CM | POA: Diagnosis not present

## 2020-12-10 DIAGNOSIS — Z79899 Other long term (current) drug therapy: Secondary | ICD-10-CM | POA: Diagnosis not present

## 2020-12-10 DIAGNOSIS — I10 Essential (primary) hypertension: Secondary | ICD-10-CM | POA: Diagnosis not present

## 2020-12-30 DIAGNOSIS — R21 Rash and other nonspecific skin eruption: Secondary | ICD-10-CM | POA: Diagnosis not present

## 2020-12-30 DIAGNOSIS — I10 Essential (primary) hypertension: Secondary | ICD-10-CM | POA: Diagnosis not present

## 2021-01-08 DIAGNOSIS — H10829 Rosacea conjunctivitis, unspecified eye: Secondary | ICD-10-CM | POA: Diagnosis not present

## 2021-01-08 DIAGNOSIS — H02102 Unspecified ectropion of right lower eyelid: Secondary | ICD-10-CM | POA: Diagnosis not present

## 2021-01-08 DIAGNOSIS — H3561 Retinal hemorrhage, right eye: Secondary | ICD-10-CM | POA: Diagnosis not present

## 2021-01-08 DIAGNOSIS — H18593 Other hereditary corneal dystrophies, bilateral: Secondary | ICD-10-CM | POA: Diagnosis not present

## 2021-01-08 DIAGNOSIS — Z961 Presence of intraocular lens: Secondary | ICD-10-CM | POA: Diagnosis not present

## 2021-01-27 DIAGNOSIS — M19042 Primary osteoarthritis, left hand: Secondary | ICD-10-CM | POA: Diagnosis not present

## 2021-01-27 DIAGNOSIS — M19041 Primary osteoarthritis, right hand: Secondary | ICD-10-CM | POA: Diagnosis not present

## 2021-02-14 ENCOUNTER — Telehealth: Payer: Self-pay | Admitting: Cardiology

## 2021-02-14 NOTE — Telephone Encounter (Signed)
Spoke to the patient just now and she let me know that right now her heart rate is between 66-67. She states that sometimes when she wakes up her heart rate is in the 50's. I advised to her that it is normal for her heart rate to go down when she is resting/sleeping but she is adamant that she may need to decrease her medication dosages.   I advised that I will send a message over to Dr. Bettina Gavia to get his advise.

## 2021-02-14 NOTE — Telephone Encounter (Signed)
Tried calling patient. No answer and no voicemail set up for me to leave a message. 

## 2021-02-14 NOTE — Telephone Encounter (Signed)
.  STAT if HR is under 50 or over 120 (normal HR is 60-100 beats per minute)  What is your heart rate? At this time it is 83 -56, 57, 48, 60 and 51  Do you have a log of your heart rate readings (document readings)? no  Do you have any other symptoms? No energy- pt wants to be seen- first available appt was 02-26-21

## 2021-02-14 NOTE — Telephone Encounter (Signed)
Left message on patients voicemail to please return our call.   

## 2021-02-17 NOTE — Telephone Encounter (Signed)
[place] is returning [employer]. States she does not know if she will be there when calling back, if not please leave a detailed message. Please advise.

## 2021-02-17 NOTE — Telephone Encounter (Signed)
Spoke with patient Relayed Dr. Joya Gaskins message of reducing her Cardizem dose to once daily and recording her blood pressure and heart rate twice daily and bringing them to her next appointment on 7/20. She verbalized understanding. Not questions expressed at this time.

## 2021-02-26 ENCOUNTER — Ambulatory Visit: Payer: Medicare Other | Admitting: Cardiology

## 2021-02-28 ENCOUNTER — Ambulatory Visit (INDEPENDENT_AMBULATORY_CARE_PROVIDER_SITE_OTHER): Payer: Medicare Other

## 2021-02-28 ENCOUNTER — Ambulatory Visit (INDEPENDENT_AMBULATORY_CARE_PROVIDER_SITE_OTHER): Payer: Medicare Other | Admitting: Cardiology

## 2021-02-28 ENCOUNTER — Other Ambulatory Visit: Payer: Self-pay

## 2021-02-28 ENCOUNTER — Encounter: Payer: Self-pay | Admitting: Cardiology

## 2021-02-28 VITALS — BP 162/82 | HR 68 | Ht 61.0 in | Wt 138.6 lb

## 2021-02-28 DIAGNOSIS — R001 Bradycardia, unspecified: Secondary | ICD-10-CM

## 2021-02-28 DIAGNOSIS — I10 Essential (primary) hypertension: Secondary | ICD-10-CM | POA: Diagnosis not present

## 2021-02-28 DIAGNOSIS — Z7901 Long term (current) use of anticoagulants: Secondary | ICD-10-CM | POA: Diagnosis not present

## 2021-02-28 DIAGNOSIS — I480 Paroxysmal atrial fibrillation: Secondary | ICD-10-CM

## 2021-02-28 HISTORY — DX: Bradycardia, unspecified: R00.1

## 2021-02-28 MED ORDER — DILTIAZEM HCL ER COATED BEADS 180 MG PO CP24
180.0000 mg | ORAL_CAPSULE | ORAL | 3 refills | Status: DC
Start: 2021-02-28 — End: 2021-06-02

## 2021-02-28 NOTE — Patient Instructions (Signed)
Medication Instructions:  Your physician has recommended you make the following change in your medication:  DECREASE: Cardizem 180 mg take one tablet by mouth every other day.,  *If you need a refill on your cardiac medications before your next appointment, please call your pharmacy*   Lab Work: None If you have labs (blood work) drawn today and your tests are completely normal, you will receive your results only by: [place] (if you have MyChart) OR A paper copy in the mail If you have any lab test that is abnormal or we need to change your treatment, we will call you to review the results.   Testing/Procedures: A zio monitor was ordered today. It will remain on for 3 days. You will then return monitor and event diary in provided box. It takes 1-2 weeks for report to be downloaded and returned to [place]. We will call you with the results. If monitor falls off or has orange flashing light, please call Zio for further instructions.     Follow-Up: At [facility], you and your health needs are our priority.  As part of our continuing mission to provide you with exceptional heart care, we have created designated Provider Care Teams.  These Care Teams include your primary Cardiologist (physician) and Advanced Practice Providers (APPs -  Physician Assistants and Nurse Practitioners) who all work together to provide you with the care you need, when you need it.  We recommend signing up for the patient portal called "MyChart".  Sign up information is provided on this After Visit Summary.  MyChart is used to connect with patients for Virtual Visits (Telemedicine).  Patients are able to view lab/test results, encounter notes, upcoming appointments, etc.  Non-urgent messages can be sent to your provider as well.   To learn more about what you can do with MyChart, go to [email or web address].    Your next appointment:   6 month(s)  The format for your next appointment:   In  Person  Provider:   Shirlee More, MD   Other Instructions

## 2021-02-28 NOTE — Progress Notes (Signed)
Cardiology Office Note:    Date:  02/28/2021   ID:  [name], DOB 07/03/1941, MRN [id number]  PCP:  Sydney Dress, MD  Cardiologist:  Sydney More, MD    Referring MD: Sydney Dress, MD    ASSESSMENT:    1. Bradycardia   2. Paroxysmal atrial fibrillation (HCC)   3. Essential hypertension   4. Chronic anticoagulation    PLAN:    In order of problems listed above:  She is having relative bradycardia I am just unsure what the importance of this is I think it be reasonable to reduce her calcium channel blocker to every other day long-lasting apply 3-day ZIO monitor to assess heart rate variability and screen for significant bradycardia but I will think there is any issues like needing a pacemaker and I have offered her reassurance. Stable good result from EP ablations not on antiarrhythmic drug continue her anticoagulant Home BPs are consistently in range less than 130 140/60-70 continue to monitor   Next appointment: 6 months I may bring her back sooner if there is abnormality noted on the monitor   Medication Adjustments/Labs and Tests Ordered: Current medicines are reviewed at length with the patient today.  Concerns regarding medicines are outlined above.  Orders Placed This Encounter  Procedures   LONG TERM MONITOR (3-14 DAYS)   Meds ordered this encounter  Medications   diltiazem (CARDIZEM CD) 180 MG 24 hr capsule    Sig: Take 1 capsule (180 mg total) by mouth every other day. Takes at 0830    Dispense:  45 capsule    Refill:  3    No chief complaint on file.   History of Present Illness:    [name] is a 80 y.o. female with a hx of paroxysmal atrial fibrillation chronic anticoagulation and hypertension last seen 09/06/2020.She has had multiple EP procedures including pulmonary vein ablation 2010 and 2012.  Compliance with diet, lifestyle and medications: Yes  She monitors her heart rate closely and she is very concerned because daytime she  gets rates 50-65 thinks it is an adequate and early morning gets rates of 40-50 and at times does not take her Cardizem CD. She is not having palpitation or syncope exercise intolerance chest pain edema or shortness of breath. She continues her anticoagulant. Past Medical History:  Diagnosis Date    Possible Neuropathy 04/16/2015   Adverse effect of drug 04/16/2015   Anesthesia to pain 03/02/2017   Overview:  reaction to Protamine with 2nd cardiac ablation.   Anticoagulant long-term use    ELIQUIS   Anticoagulated 06/20/2016   Anxiety 12/05/2015   Atrial fibrillation ([place]) 01/10/2014   Atrial flutter by electrocardiogram ([place]) 06/06/2014   Atrial flutter, paroxysmal (HCC)    Atypical atrial flutter ([place]) 10/21/2016   Carotid atherosclerosis 04/21/2018   Chronic anticoagulation 02/13/2015   Congenital anomaly of pulmonary vein    per Cardiac MRI done at [facility] 02-16-2014-- noted 5 pulmonary veins (3 on the right and 2 on the left ) to left atrium   Congestive heart failure ([place]) 01/10/2014   Current use of long term anticoagulation 02/13/2015   Drug reaction to Amoxicillin 04/16/2015   Eczema 04/16/2015   Encounter for monitoring sotalol therapy 06/06/2014   Encounter for therapeutic drug level monitoring 06/06/2014   Esophageal reflux 01/10/2014   First degree heart block    Gastroesophageal reflux disease 01/10/2014   GERD (gastroesophageal reflux disease)    High risk medication use 02/13/2015   Overview:  Ticosyn   History of amiodarone therapy 06/20/2016   History of diverticulitis 06/25/2016   History of diverticulitis of colon    04/ 2017   Hyperlipidemia    Hypertension    Hyponatremia 05/17/2016   Hypothyroid 12/05/2015   Hypothyroidism    Hypothyroidism (acquired) 01/10/2014   Long term current use of antiarrhythmic drug 10/15/2016   Mild CAD 01/17/2019   On amiodarone therapy 03/23/2016   Ovarian cyst 12/05/2015   PAF (paroxysmal atrial fibrillation) [facility]) cardiologist- [name])    Paroxysmal atrial fibrillation ([place]) 02/13/2015   Overview:  had pulmonary vein ablation for AF in 2010 and redo for recurrence in April 2012  * CHADS2 score=2  Overview:  had pulmonary vein ablation for AF in 2010 and redo for recurrence in April 2012  * CHADS2 score=2   PAT (paroxysmal atrial tachycardia) (HCC)    Pre-operative cardiovascular examination 03/03/2017   Rectocele 12/05/2015   S/P ablation of atrial fibrillation 2011  &  2012  &  02-19-2014 at [place] cardiologist-- Sydney Norm Salt ([place])   Systolic CHF [facility])    Urethral stenosis    Urethral stricture    Ventral hernia 05/17/2016   Wears glasses     Past Surgical History:  Procedure Laterality Date   ABDOMINAL HERNIA REPAIR  10/ 2017   at [facility]/  and [place]  09-08-2010   CYSTOCELE AND [place]) &  2006 (Sydney Lia [place])   lvf preserved/ 45-50% mid rca/  40% lad after takeoff diagonal branch   CARDIAC ELECTROPHYSIOLOGY STUDY AND ABLATION  x3 2011  &  2012  ([place] [facility]) and 02-19-2014 at [place]   per EP study report 02-19-2014-- re-do PVI (LIPV & RSPV re-isolated) left atrial roof line & CTI line performed   CARDIOVASCULAR STRESS TEST  04-08-2009     normal study/ ef 65%   CARDIOVERSION  last one 09-01-2016  in [place] N/A 12/23/2012   Procedure: CYSTOSCOPY WITH BALLOON DILATATION;  Surgeon: Sydney Jabs, MD;  Location: [facility];  Service: Urology;  Laterality: N/A;   TRANSESOPHAGEAL ECHOCARDIOGRAM  05-20-2009   normal lvf and structure/ ef 60-65%/ no thrombus   TUBAL LIGATION  age 60    Current Medications: Current Meds  Medication Sig   acetaminophen (TYLENOL) 500 MG tablet Take by mouth every 6 (six) hours as needed.    ALPRAZolam (XANAX) 0.25 MG tablet Take 0.25 mg by mouth 2 (two) times daily as needed.    ELIQUIS 5 MG TABS tablet Take  5 mg by mouth 2 (two) times daily. Pt aware to stop 3 days prior to [facility] 10-16-2016   levothyroxine (SYNTHROID) 75 MCG tablet Take 75 mcg by mouth daily before breakfast.   lisinopril (ZESTRIL) 5 MG tablet Take 5 mg by mouth daily.   loratadine (CLARITIN) 10 MG tablet Take 10 mg by mouth as needed for allergies.   pantoprazole (PROTONIX) 40 MG tablet TK 1 T PO D-- takes in afternoon   senna (SENOKOT) 8.6 MG tablet Take 1 tablet by mouth daily as needed for constipation.   simvastatin (ZOCOR) 40 MG tablet Take 40 mg by mouth at bedtime.   [DISCONTINUED] diltiazem (CARDIZEM CD) 180 MG 24 hr capsule Take 1 capsule (180 mg total) by mouth 2 (two) times daily. Takes at [employer]  Allergies:   Amiodarone, Protamine, Amoxicillin, Celebrex [celecoxib], Clindamycin, Cleocin [clindamycin hcl], and Diflucan [fluconazole]   Social History   Socioeconomic History   Marital status: Widowed    Spouse name: Not on file   Number of children: 4   Years of education: Not on file   Highest education level: Some college, no degree  Occupational History   Occupation: retired  Tobacco Use   Smoking status: Former    Packs/day: 0.75    Years: 30.00    Pack years: 22.50    Types: Cigarettes    Quit date: 12/21/2004    Years since quitting: 16.2   Smokeless tobacco: Never  Vaping Use   Vaping Use: Never used  Substance and Sexual Activity   Alcohol use: No   Drug use: No   Sexual activity: Not on file  Other Topics Concern   Not on file  Social History Narrative   Patient is right-handed. She lives in a one level home. She drinks two 8 oz glasses of tea a day, an occasional soda and rarely coffee.    Social Determinants of Health   Financial Resource Strain: Not on file  Food Insecurity: Not on file  Transportation Needs: Not on file  Physical Activity: Not on file  Stress: Not on file  Social Connections: Not on file     Family History: The patient's family history includes Diabetes in her  maternal grandfather and son; Heart attack in her father and sister; Liver cancer in her maternal grandfather and maternal grandmother; Stroke in her mother and paternal grandmother. ROS:   Please see the history of present illness.    All other systems reviewed and are negative.  EKGs/Labs/Other Studies Reviewed:    The following studies were reviewed today:   Physical Exam:    VS:  BP (!) 162/82 (BP Location: Right Arm, Patient Position: Sitting)   Pulse 68   Ht '5\' 1"'$  (1.549 m)   Wt 138 lb 9.6 oz (62.9 kg)   SpO2 98%   BMI 26.19 kg/m     Wt Readings from Last 3 Encounters:  02/28/21 138 lb 9.6 oz (62.9 kg)  09/06/20 139 lb (63 kg)  02/22/19 138 lb 12.8 oz (63 kg)     GEN: Quite anxious well nourished, well developed in no acute distress HEENT: Normal NECK: No JVD; No carotid bruits LYMPHATICS: No lymphadenopathy CARDIAC: RRR, no murmurs, rubs, gallops RESPIRATORY:  Clear to auscultation without rales, wheezing or rhonchi  ABDOMEN: Soft, non-tender, non-distended MUSCULOSKELETAL:  No edema; No deformity  SKIN: Warm and dry NEUROLOGIC:  Rober[place]on and oriented x 3 PSYCHIATRIC:  Normal affect    Signed, Sydney More, MD  02/28/2021 3:57 PM    [facility]

## 2021-03-06 DIAGNOSIS — I480 Paroxysmal atrial fibrillation: Secondary | ICD-10-CM | POA: Diagnosis not present

## 2021-03-10 ENCOUNTER — Telehealth: Payer: Self-pay

## 2021-03-10 DIAGNOSIS — N3592 Unspecified urethral stricture, female: Secondary | ICD-10-CM | POA: Diagnosis not present

## 2021-03-10 NOTE — Telephone Encounter (Signed)
-----   Message from Richardo Priest, MD sent at 03/09/2021 12:16 PM EDT ----- Good result we did this because of slow heart rate reduce the dose of her calcium channel blocker and there are no problems with pauses or low heart rates this is a good result.

## 2021-03-10 NOTE — Telephone Encounter (Signed)
Spoke with patient regarding results and recommendation.  Patient verbalizes understanding and is agreeable to plan of care. Advised patient to call back with any issues or concerns.  

## 2021-03-12 DIAGNOSIS — F419 Anxiety disorder, unspecified: Secondary | ICD-10-CM | POA: Diagnosis not present

## 2021-03-12 DIAGNOSIS — E785 Hyperlipidemia, unspecified: Secondary | ICD-10-CM | POA: Diagnosis not present

## 2021-03-12 DIAGNOSIS — I700 Atherosclerosis of aorta: Secondary | ICD-10-CM | POA: Diagnosis not present

## 2021-03-12 DIAGNOSIS — I10 Essential (primary) hypertension: Secondary | ICD-10-CM | POA: Diagnosis not present

## 2021-03-12 DIAGNOSIS — E871 Hypo-osmolality and hyponatremia: Secondary | ICD-10-CM | POA: Diagnosis not present

## 2021-03-12 DIAGNOSIS — I4891 Unspecified atrial fibrillation: Secondary | ICD-10-CM | POA: Diagnosis not present

## 2021-03-12 DIAGNOSIS — E039 Hypothyroidism, unspecified: Secondary | ICD-10-CM | POA: Diagnosis not present

## 2021-03-12 DIAGNOSIS — Z79899 Other long term (current) drug therapy: Secondary | ICD-10-CM | POA: Diagnosis not present

## 2021-03-19 DIAGNOSIS — K5792 Diverticulitis of intestine, part unspecified, without perforation or abscess without bleeding: Secondary | ICD-10-CM | POA: Diagnosis not present

## 2021-04-15 DIAGNOSIS — M26641 Arthritis of right temporomandibular joint: Secondary | ICD-10-CM | POA: Diagnosis not present

## 2021-04-15 DIAGNOSIS — J0110 Acute frontal sinusitis, unspecified: Secondary | ICD-10-CM | POA: Diagnosis not present

## 2021-04-15 DIAGNOSIS — N3592 Unspecified urethral stricture, female: Secondary | ICD-10-CM | POA: Diagnosis not present

## 2021-05-13 DIAGNOSIS — K644 Residual hemorrhoidal skin tags: Secondary | ICD-10-CM | POA: Diagnosis not present

## 2021-05-13 DIAGNOSIS — K648 Other hemorrhoids: Secondary | ICD-10-CM | POA: Diagnosis not present

## 2021-05-23 DIAGNOSIS — R2681 Unsteadiness on feet: Secondary | ICD-10-CM | POA: Diagnosis not present

## 2021-05-23 DIAGNOSIS — D638 Anemia in other chronic diseases classified elsewhere: Secondary | ICD-10-CM | POA: Diagnosis not present

## 2021-05-23 DIAGNOSIS — I700 Atherosclerosis of aorta: Secondary | ICD-10-CM | POA: Diagnosis not present

## 2021-05-23 DIAGNOSIS — I10 Essential (primary) hypertension: Secondary | ICD-10-CM | POA: Diagnosis not present

## 2021-05-23 DIAGNOSIS — E785 Hyperlipidemia, unspecified: Secondary | ICD-10-CM | POA: Diagnosis not present

## 2021-05-23 DIAGNOSIS — I160 Hypertensive urgency: Secondary | ICD-10-CM | POA: Diagnosis not present

## 2021-05-23 DIAGNOSIS — R519 Headache, unspecified: Secondary | ICD-10-CM | POA: Diagnosis not present

## 2021-05-23 DIAGNOSIS — R5383 Other fatigue: Secondary | ICD-10-CM | POA: Diagnosis not present

## 2021-05-23 DIAGNOSIS — Z7901 Long term (current) use of anticoagulants: Secondary | ICD-10-CM | POA: Diagnosis not present

## 2021-05-23 DIAGNOSIS — R9431 Abnormal electrocardiogram [ECG] [EKG]: Secondary | ICD-10-CM | POA: Diagnosis not present

## 2021-05-23 DIAGNOSIS — R270 Ataxia, unspecified: Secondary | ICD-10-CM | POA: Diagnosis not present

## 2021-05-23 DIAGNOSIS — E039 Hypothyroidism, unspecified: Secondary | ICD-10-CM | POA: Diagnosis not present

## 2021-05-23 DIAGNOSIS — I480 Paroxysmal atrial fibrillation: Secondary | ICD-10-CM | POA: Diagnosis not present

## 2021-05-23 DIAGNOSIS — F419 Anxiety disorder, unspecified: Secondary | ICD-10-CM | POA: Diagnosis not present

## 2021-05-23 DIAGNOSIS — Z79899 Other long term (current) drug therapy: Secondary | ICD-10-CM | POA: Diagnosis not present

## 2021-05-23 DIAGNOSIS — Z20822 Contact with and (suspected) exposure to covid-19: Secondary | ICD-10-CM | POA: Diagnosis not present

## 2021-05-23 DIAGNOSIS — I471 Supraventricular tachycardia: Secondary | ICD-10-CM | POA: Diagnosis not present

## 2021-05-23 DIAGNOSIS — R42 Dizziness and giddiness: Secondary | ICD-10-CM | POA: Diagnosis not present

## 2021-05-23 DIAGNOSIS — K219 Gastro-esophageal reflux disease without esophagitis: Secondary | ICD-10-CM | POA: Diagnosis not present

## 2021-05-27 DIAGNOSIS — I160 Hypertensive urgency: Secondary | ICD-10-CM | POA: Diagnosis not present

## 2021-05-27 DIAGNOSIS — F419 Anxiety disorder, unspecified: Secondary | ICD-10-CM | POA: Diagnosis not present

## 2021-05-27 DIAGNOSIS — R270 Ataxia, unspecified: Secondary | ICD-10-CM | POA: Diagnosis not present

## 2021-05-27 DIAGNOSIS — K219 Gastro-esophageal reflux disease without esophagitis: Secondary | ICD-10-CM | POA: Diagnosis not present

## 2021-05-27 DIAGNOSIS — R9431 Abnormal electrocardiogram [ECG] [EKG]: Secondary | ICD-10-CM | POA: Diagnosis not present

## 2021-05-27 DIAGNOSIS — R519 Headache, unspecified: Secondary | ICD-10-CM | POA: Diagnosis not present

## 2021-06-02 ENCOUNTER — Encounter: Payer: Self-pay | Admitting: Cardiology

## 2021-06-13 DIAGNOSIS — I4891 Unspecified atrial fibrillation: Secondary | ICD-10-CM | POA: Diagnosis not present

## 2021-06-13 DIAGNOSIS — E785 Hyperlipidemia, unspecified: Secondary | ICD-10-CM | POA: Diagnosis not present

## 2021-06-13 DIAGNOSIS — I10 Essential (primary) hypertension: Secondary | ICD-10-CM | POA: Diagnosis not present

## 2021-06-13 DIAGNOSIS — E871 Hypo-osmolality and hyponatremia: Secondary | ICD-10-CM | POA: Diagnosis not present

## 2021-06-13 DIAGNOSIS — E039 Hypothyroidism, unspecified: Secondary | ICD-10-CM | POA: Diagnosis not present

## 2021-06-13 DIAGNOSIS — F419 Anxiety disorder, unspecified: Secondary | ICD-10-CM | POA: Diagnosis not present

## 2021-06-13 DIAGNOSIS — Z79899 Other long term (current) drug therapy: Secondary | ICD-10-CM | POA: Diagnosis not present

## 2021-06-20 ENCOUNTER — Other Ambulatory Visit: Payer: Self-pay

## 2021-06-20 ENCOUNTER — Ambulatory Visit (INDEPENDENT_AMBULATORY_CARE_PROVIDER_SITE_OTHER): Payer: Medicare Other | Admitting: Cardiology

## 2021-06-20 ENCOUNTER — Encounter: Payer: Self-pay | Admitting: Cardiology

## 2021-06-20 VITALS — Ht 61.6 in | Wt 138.4 lb

## 2021-06-20 DIAGNOSIS — R9431 Abnormal electrocardiogram [ECG] [EKG]: Secondary | ICD-10-CM

## 2021-06-20 DIAGNOSIS — I119 Hypertensive heart disease without heart failure: Secondary | ICD-10-CM | POA: Diagnosis not present

## 2021-06-20 DIAGNOSIS — I480 Paroxysmal atrial fibrillation: Secondary | ICD-10-CM | POA: Diagnosis not present

## 2021-06-20 DIAGNOSIS — I10 Essential (primary) hypertension: Secondary | ICD-10-CM | POA: Diagnosis not present

## 2021-06-20 HISTORY — DX: Hypertensive heart disease without heart failure: I11.9

## 2021-06-20 MED ORDER — CLONIDINE HCL 0.1 MG PO TABS: 0.1000 mg | ORAL_TABLET | Freq: Once | ORAL | 11 refills | Status: DC

## 2021-06-20 NOTE — Progress Notes (Signed)
Cardiology Office Note:    Date:  06/20/2021   ID:  [name], DOB 12-06-1940, MRN [id number]  PCP:  Sydney Dress, MD  Cardiologist:  Sydney More, MD    Referring MD: Sydney Dress, MD    ASSESSMENT:    1. Hypertensive heart disease without heart failure   2. Nonspecific abnormal electrocardiogram (ECG) (EKG)   3. Paroxysmal atrial fibrillation ([place])   4. Primary hypertension    PLAN:    In order of problems listed above:  In retrospect she had a hypertensive emergency with CNS side effects when seen in the emergency room no recurrence blood pressure is in range being tracked at home and I will give her a prescription for Catapres she can take as needed in the future EKGs represent LVH repolarization stable pattern recheck echocardiogram Stable no clinical recurrence unrelated to her presentation in the ED   Next appointment: 6 months   Medication Adjustments/Labs and Tests Ordered: Current medicines are reviewed at length with the patient today.  Concerns regarding medicines are outlined above.  Orders Placed This Encounter  Procedures   EKG 12-Lead   ECHOCARDIOGRAM COMPLETE   Meds ordered this encounter  Medications   cloNIDine (CATAPRES) 0.1 MG tablet    Sig: Take 1 tablet (0.1 mg total) by mouth once for 1 dose.    Dispense:  60 tablet    Refill:  11    Chief Complaint  Patient presents with   Follow-up   Hypertension     History of Present Illness:    [name] is a 80 y.o. female with a hx of paroxysmal atrial fibrillation with chronic anticoagulation hypertensive heart disease she has had multiple EP procedures including pulmonary vein isolation in 2010 and 2012 last seen 02/28/2021.  Compliance with diet, lifestyle and medications: Yes  Home blood pressure runs 120-130/70 and she is just unsure how she ended up with this hypertensive emergency with CNS side effects and ED visit. Compliant with her antihypertensive not having  palpitations syncope shortness of breath edema or chest pains. Given her prescription for as needed Catapres she can take for systolics greater than 482 She brings a handwritten note and asked me if she has hypertrophic cardiomyopathy At times elderly with severe longstanding hypertension can develop a nongenetic form and has to have an echocardiogram performed.  She recently presented to [facility] with accelerated hypertension blood pressure recorded 152/120 associated with gait disturbance and ataxia.  Her EKG was noted to show continued ST and T abnormality.  CBC was normal hemoglobin 14.2 creatinine 0.7 potassium 3.9 proBNP level was checked again as she has no complaints of shortness of breath it was low at 190 pain and although she had no chest pain troponin was assessed was nondetectable.  CT of the head without contrast showed no acute changes her EKG performed showed sinus rhythm LVH repolarization.  It is unchanged compared to her baseline 09/06/2020 Past Medical History:  Diagnosis Date    Possible Neuropathy 04/16/2015   Adverse effect of drug 04/16/2015   Anesthesia to pain 03/02/2017   Overview:  reaction to Protamine with 2nd cardiac ablation.   Anticoagulant long-term use    ELIQUIS   Anticoagulated 06/20/2016   Anxiety 12/05/2015   Atrial fibrillation ([place]) 01/10/2014   Atrial flutter by electrocardiogram ([place]) 06/06/2014   Atrial flutter, paroxysmal ([facility])    Atypical atrial flutter ([place]) 10/21/2016   Bradycardia 02/28/2021   Carotid atherosclerosis 04/21/2018   Chronic anticoagulation 02/13/2015  Congenital anomaly of pulmonary vein    per Cardiac [facility] done at [facility] 02-16-2014-- noted 5 pulmonary veins (3 on the right and 2 on the left ) to left atrium   Congestive heart failure ([place]) 01/10/2014   Current use of long term anticoagulation 02/13/2015   Drug reaction to Amoxicillin 04/16/2015   Eczema 04/16/2015   Encounter for monitoring sotalol therapy 06/06/2014    Encounter for therapeutic drug level monitoring 06/06/2014   Esophageal reflux 01/10/2014   First degree heart block    Gastroesophageal reflux disease 01/10/2014   GERD (gastroesophageal reflux disease)    High risk medication use 02/13/2015   Overview:  Ticosyn   History of amiodarone therapy 06/20/2016   History of diverticulitis 06/25/2016   History of diverticulitis of colon    04/ 2017   Hyperlipidemia    Hypertension    Hyponatremia 05/17/2016   Hypothyroid 12/05/2015   Hypothyroidism    Hypothyroidism (acquired) 01/10/2014   Long term current use of antiarrhythmic drug 10/15/2016   Mild CAD 01/17/2019   On amiodarone therapy 03/23/2016   Ovarian cyst 12/05/2015   PAF (paroxysmal atrial fibrillation) [facility]) cardiologist- dr Bettina Gavia [place])   Paroxysmal atrial fibrillation ([place]) 02/13/2015   Overview:  had pulmonary vein ablation for AF in 2010 and redo for recurrence in April 2012  * CHADS2 score=2  Overview:  had pulmonary vein ablation for AF in 2010 and redo for recurrence in April 2012  * CHADS2 score=2   PAT (paroxysmal atrial tachycardia) ([place])    Pre-operative cardiovascular examination 03/03/2017   Rectocele 12/05/2015   S/P ablation of atrial fibrillation 2011  &  2012  &  02-19-2014 at [place] cardiologist-- dr Norm Salt ([place])   Systolic CHF [facility])    Urethral stricture    Ventral hernia 05/17/2016   Wears glasses     Past Surgical History:  Procedure Laterality Date   ABDOMINAL HERNIA REPAIR  10/ 2017   at [place]  and [place]  06/1985   ANTERIOR REPAIR AND TRANSOBTURATOR SLING  09/08/2010   CYSTOCELE AND SUI   CARDIAC CATHETERIZATION  1991 ([place]) &  2006 (dr Lia Foyer)   lvf preserved/ 45-50% mid rca/  40% lad after takeoff diagonal branch   CARDIAC ELECTROPHYSIOLOGY STUDY AND ABLATION  x3 2011  &  2012  ([facility]) and 02-19-2014 at [place]   per EP study report 02-19-2014-- re-do PVI (LIPV & RSPV  re-isolated) left atrial roof line & CTI line performed   CARDIOVASCULAR STRESS TEST  04/08/2009   normal study/ ef 65%   CARDIOVERSION  last one 09-01-2016  in [place] Bilateral    CYSTOSCOPY WITH URETHRAL DILATATION N/A 12/23/2012   Procedure: CYSTOSCOPY WITH BALLOON DILATATION;  Surgeon: Claybon Jabs, MD;  Location: [facility];  Service: Urology;  Laterality: N/A;   RECTOCELE REPAIR  03/23/2017   TRANSESOPHAGEAL ECHOCARDIOGRAM  05/20/2009   normal lvf and structure/ ef 60-65%/ no thrombus   TUBAL LIGATION  1978    Current Medications: Current Meds  Medication Sig   acetaminophen (TYLENOL) 650 MG CR tablet Take 650 mg by mouth 3 (three) times daily as needed for pain.   ALPRAZolam (XANAX) 0.25 MG tablet Take 0.25 mg by mouth 3 (three) times daily as needed for anxiety.   cloNIDine (CATAPRES) 0.1 MG tablet Take 1 tablet (0.1 mg total) by mouth once for 1 dose.   diltiazem (CARDIZEM CD)  120 MG 24 hr capsule Take 120 mg by mouth 2 (two) times daily. Morning and evening   DULoxetine (CYMBALTA) 30 MG capsule Take 30 mg by mouth daily.   ELIQUIS 5 MG TABS tablet Take 5 mg by mouth 2 (two) times daily.   famotidine (PEPCID) 40 MG tablet Take 40 mg by mouth at bedtime.   hydrochlorothiazide (MICROZIDE) 12.5 MG capsule Take 12.5 mg by mouth daily.   levothyroxine (SYNTHROID) 75 MCG tablet Take 75 mcg by mouth daily before breakfast.   lisinopril (ZESTRIL) 5 MG tablet Take 5 mg by mouth at bedtime.   loratadine (CLARITIN) 10 MG tablet Take 10 mg by mouth as needed for allergies.   ondansetron (ZOFRAN-ODT) 4 MG disintegrating tablet Take 4 mg by mouth 4 (four) times daily as needed for nausea or vomiting.   pantoprazole (PROTONIX) 40 MG tablet Take 40 mg by mouth daily.   simvastatin (ZOCOR) 40 MG tablet Take 40 mg by mouth at bedtime.     Allergies:   Amiodarone, Protamine, Amoxicillin, Celebrex [celecoxib], Clindamycin, Ace inhibitors, Cleocin  [clindamycin hcl], and Diflucan [fluconazole]   Social History   Socioeconomic History   Marital status: Widowed    Spouse name: Not on file   Number of children: 4   Years of education: Not on file   Highest education level: Some college, no degree  Occupational History   Occupation: retired  Tobacco Use   Smoking status: Former    Packs/day: 0.75    Years: 30.00    Pack years: 22.50    Types: Cigarettes    Quit date: 12/21/2004    Years since quitting: 16.5   Smokeless tobacco: Never  Vaping Use   Vaping Use: Never used  Substance and Sexual Activity   Alcohol use: No   Drug use: No   Sexual activity: Not on file  Other Topics Concern   Not on file  Social History Narrative   Patient is right-handed. She lives in a one level home. She drinks two 8 oz glasses of tea a day, an occasional soda and rarely coffee.    Social Determinants of Health   Financial Resource Strain: Not on file  Food Insecurity: Not on file  Transportation Needs: Not on file  Physical Activity: Not on file  Stress: Not on file  Social Connections: Not on file     Family History: The patient's family history includes Congestive Heart Failure in her mother; Diabetes in her maternal grandfather and son; Heart attack in her father and sister; Heart disease in her father; Hypertension in her mother; Liver cancer in her maternal grandfather and maternal grandmother; Stroke in her mother and paternal grandmother. ROS:   Please see the history of present illness.    All other systems reviewed and are negative.  EKGs/Labs/Other Studies Reviewed:    The following studies were reviewed today:  EKG:  EKG ordered today and personally reviewed.  The ekg ordered today demonstrates sinus rhythm LVH repolarization unchanged from previous EKG    Physical Exam:    VS:  Ht 5' 1.6" (1.565 m)   Wt 138 lb 6.4 oz (62.8 kg)   BMI 25.64 kg/m     Wt Readings from Last 3 Encounters:  06/20/21 138 lb 6.4 oz  (62.8 kg)  02/28/21 138 lb 9.6 oz (62.9 kg)  09/06/20 139 lb (63 kg)     GEN: She is very anxious well nourished, well developed in no acute distress HEENT: Normal NECK: No JVD;  No carotid bruits LYMPHATICS: No lymphadenopathy CARDIAC: RRR, no murmurs, rubs, gallops RESPIRATORY:  Clear to auscultation without rales, wheezing or rhonchi  ABDOMEN: Soft, non-tender, non-distended MUSCULOSKELETAL:  No edema; No deformity  SKIN: Warm and dry NEUROLOGIC:  Alert and oriented x 3 PSYCHIATRIC:  Normal affect    Signed, Sydney More, MD  06/20/2021 12:07 PM    [place]

## 2021-06-20 NOTE — Patient Instructions (Signed)
Medication Instructions:  Your physician has recommended you make the following change in your medication:   Take Clonidine 0.1 mg daily as needed for BP systolic greater than 709 on repeated BP.  *If you need a refill on your cardiac medications before your next appointment, please call your pharmacy*   Lab Work: None ordered If you have labs (blood work) drawn today and your tests are completely normal, you will receive your results only by: [place] (if you have MyChart) OR A paper copy in the mail If you have any lab test that is abnormal or we need to change your treatment, we will call you to review the results.   Testing/Procedures: Your physician has requested that you have an echocardiogram. Echocardiography is a painless test that uses sound waves to create images of your heart. It provides your doctor with information about the size and shape of your heart and how well your heart's chambers and valves are working. This procedure takes approximately one hour. There are no restrictions for this procedure.    Follow-Up: At [facility], you and your health needs are our priority.  As part of our continuing mission to provide you with exceptional heart care, we have created designated Provider Care Teams.  These Care Teams include your primary Cardiologist (physician) and Advanced Practice Providers (APPs -  Physician Assistants and Nurse Practitioners) who all work together to provide you with the care you need, when you need it.  We recommend signing up for the patient portal called "MyChart".  Sign up information is provided on this After Visit Summary.  MyChart is used to connect with patients for Virtual Visits (Telemedicine).  Patients are able to view lab/test results, encounter notes, upcoming appointments, etc.  Non-urgent messages can be sent to your provider as well.   To learn more about what you can do with MyChart, go to [email or web address].    Your next  appointment:   6 month(s)  The format for your next appointment:   In Person  Provider:   Shirlee More, MD   Other Instructions Echocardiogram An echocardiogram is a test that uses sound waves (ultrasound) to produce images of the heart. Images from an echocardiogram can provide important information about: Heart size and shape. The size and thickness and movement of your heart's walls. Heart muscle function and strength. Heart valve function or if you have stenosis. Stenosis is when the heart valves are too narrow. If blood is flowing backward through the heart valves (regurgitation). A tumor or infectious growth around the heart valves. Areas of heart muscle that are not working well because of poor blood flow or injury from a heart attack. Aneurysm detection. An aneurysm is a weak or damaged part of an artery wall. The wall bulges out from the normal force of blood pumping through the body. Tell a health care provider about: Any allergies you have. All medicines you are taking, including vitamins, herbs, eye drops, creams, and over-the-counter medicines. Any blood disorders you have. Any surgeries you have had. Any medical conditions you have. Whether you are pregnant or may be pregnant. What are the risks? Generally, this is a safe test. However, problems may occur, including an allergic reaction to dye (contrast) that may be used during the test. What happens before the test? No specific preparation is needed. You may eat and drink normally. What happens during the test? You will take off your clothes from the waist up and put on a hospital gown. Electrodes  or electrocardiogram (ECG)patches may be placed on your chest. The electrodes or patches are then connected to a device that monitors your heart rate and rhythm. You will lie down on a table for an ultrasound exam. A gel will be applied to your chest to help sound waves pass through your skin. A handheld device, called a  transducer, will be pressed against your chest and moved over your heart. The transducer produces sound waves that travel to your heart and bounce back (or "echo" back) to the transducer. These sound waves will be captured in real-time and changed into images of your heart that can be viewed on a video monitor. The images will be recorded on a computer and reviewed by your health care provider. You may be asked to change positions or hold your breath for a short time. This makes it easier to get different views or better views of your heart. In some cases, you may receive contrast through an IV in one of your veins. This can improve the quality of the pictures from your heart. The procedure may vary among health care providers and hospitals.   What can I expect after the test? You may return to your normal, everyday life, including diet, activities, and medicines, unless your health care provider tells you not to do that. Follow these instructions at home: It is up to you to get the results of your test. Ask your health care provider, or the department that is doing the test, when your results will be ready. Keep all follow-up visits. This is important. Summary An echocardiogram is a test that uses sound waves (ultrasound) to produce images of the heart. Images from an echocardiogram can provide important information about the size and shape of your heart, heart muscle function, heart valve function, and other possible heart problems. You do not need to do anything to prepare before this test. You may eat and drink normally. After the echocardiogram is completed, you may return to your normal, everyday life, unless your health care provider tells you not to do that. This information is not intended to replace advice given to you by your health care provider. Make sure you discuss any questions you have with your health care provider. Document Revised: 03/19/2020 Document Reviewed: 03/19/2020 Elsevier  Patient Education  2021 [employer].

## 2021-06-24 DIAGNOSIS — K648 Other hemorrhoids: Secondary | ICD-10-CM | POA: Diagnosis not present

## 2021-06-24 DIAGNOSIS — K644 Residual hemorrhoidal skin tags: Secondary | ICD-10-CM | POA: Diagnosis not present

## 2021-06-26 DIAGNOSIS — F419 Anxiety disorder, unspecified: Secondary | ICD-10-CM | POA: Diagnosis not present

## 2021-06-26 DIAGNOSIS — I10 Essential (primary) hypertension: Secondary | ICD-10-CM | POA: Diagnosis not present

## 2021-06-30 ENCOUNTER — Other Ambulatory Visit: Payer: Self-pay

## 2021-06-30 ENCOUNTER — Ambulatory Visit (INDEPENDENT_AMBULATORY_CARE_PROVIDER_SITE_OTHER): Payer: Medicare Other

## 2021-06-30 DIAGNOSIS — R9431 Abnormal electrocardiogram [ECG] [EKG]: Secondary | ICD-10-CM

## 2021-06-30 LAB — ECHOCARDIOGRAM COMPLETE
Area-P 1/2: 3.35 cm2
S' Lateral: 2.5 cm

## 2021-07-01 ENCOUNTER — Telehealth: Payer: Self-pay

## 2021-07-01 NOTE — Telephone Encounter (Signed)
Spoke with patient regarding results and recommendation.  Patient verbalizes understanding and is agreeable to plan of care. Advised patient to call back with any issues or concerns.  

## 2021-07-01 NOTE — Telephone Encounter (Signed)
-----   Message from Richardo Priest, MD sent at 07/01/2021  7:40 AM EST ----- Good result heart muscle function normal continue current treatment

## 2021-07-10 DIAGNOSIS — Z9181 History of falling: Secondary | ICD-10-CM | POA: Diagnosis not present

## 2021-07-10 DIAGNOSIS — I10 Essential (primary) hypertension: Secondary | ICD-10-CM | POA: Diagnosis not present

## 2021-07-15 DIAGNOSIS — N3592 Unspecified urethral stricture, female: Secondary | ICD-10-CM | POA: Diagnosis not present

## 2021-07-24 DIAGNOSIS — H18593 Other hereditary corneal dystrophies, bilateral: Secondary | ICD-10-CM | POA: Diagnosis not present

## 2021-07-24 DIAGNOSIS — H02102 Unspecified ectropion of right lower eyelid: Secondary | ICD-10-CM | POA: Diagnosis not present

## 2021-07-24 DIAGNOSIS — H3561 Retinal hemorrhage, right eye: Secondary | ICD-10-CM | POA: Diagnosis not present

## 2021-07-24 DIAGNOSIS — H04123 Dry eye syndrome of bilateral lacrimal glands: Secondary | ICD-10-CM | POA: Diagnosis not present

## 2021-07-24 DIAGNOSIS — Z961 Presence of intraocular lens: Secondary | ICD-10-CM | POA: Diagnosis not present

## 2021-07-24 DIAGNOSIS — H10829 Rosacea conjunctivitis, unspecified eye: Secondary | ICD-10-CM | POA: Diagnosis not present

## 2021-07-30 DIAGNOSIS — I10 Essential (primary) hypertension: Secondary | ICD-10-CM | POA: Diagnosis not present

## 2021-07-30 DIAGNOSIS — M25511 Pain in right shoulder: Secondary | ICD-10-CM | POA: Diagnosis not present

## 2021-08-15 DIAGNOSIS — N3592 Unspecified urethral stricture, female: Secondary | ICD-10-CM | POA: Diagnosis not present

## 2021-09-01 DIAGNOSIS — L820 Inflamed seborrheic keratosis: Secondary | ICD-10-CM | POA: Diagnosis not present

## 2021-09-01 DIAGNOSIS — L821 Other seborrheic keratosis: Secondary | ICD-10-CM | POA: Diagnosis not present

## 2021-09-01 DIAGNOSIS — L578 Other skin changes due to chronic exposure to nonionizing radiation: Secondary | ICD-10-CM | POA: Diagnosis not present

## 2021-09-09 DIAGNOSIS — E785 Hyperlipidemia, unspecified: Secondary | ICD-10-CM | POA: Diagnosis not present

## 2021-09-09 DIAGNOSIS — I10 Essential (primary) hypertension: Secondary | ICD-10-CM | POA: Diagnosis not present

## 2021-09-13 DIAGNOSIS — E039 Hypothyroidism, unspecified: Secondary | ICD-10-CM | POA: Diagnosis not present

## 2021-09-13 DIAGNOSIS — E785 Hyperlipidemia, unspecified: Secondary | ICD-10-CM | POA: Diagnosis not present

## 2021-09-13 DIAGNOSIS — Z6826 Body mass index (BMI) 26.0-26.9, adult: Secondary | ICD-10-CM | POA: Diagnosis not present

## 2021-09-13 DIAGNOSIS — E559 Vitamin D deficiency, unspecified: Secondary | ICD-10-CM | POA: Diagnosis not present

## 2021-09-13 DIAGNOSIS — I4891 Unspecified atrial fibrillation: Secondary | ICD-10-CM | POA: Diagnosis not present

## 2021-09-13 DIAGNOSIS — I10 Essential (primary) hypertension: Secondary | ICD-10-CM | POA: Diagnosis not present

## 2021-09-13 DIAGNOSIS — R69 Illness, unspecified: Secondary | ICD-10-CM | POA: Diagnosis not present

## 2021-09-13 DIAGNOSIS — K219 Gastro-esophageal reflux disease without esophagitis: Secondary | ICD-10-CM | POA: Diagnosis not present

## 2021-09-13 DIAGNOSIS — Z139 Encounter for screening, unspecified: Secondary | ICD-10-CM | POA: Diagnosis not present

## 2021-09-13 DIAGNOSIS — E871 Hypo-osmolality and hyponatremia: Secondary | ICD-10-CM | POA: Diagnosis not present

## 2021-09-13 DIAGNOSIS — Z79899 Other long term (current) drug therapy: Secondary | ICD-10-CM | POA: Diagnosis not present

## 2021-09-13 DIAGNOSIS — E538 Deficiency of other specified B group vitamins: Secondary | ICD-10-CM | POA: Diagnosis not present

## 2021-09-15 DIAGNOSIS — N3592 Unspecified urethral stricture, female: Secondary | ICD-10-CM | POA: Diagnosis not present

## 2021-09-16 DIAGNOSIS — D519 Vitamin B12 deficiency anemia, unspecified: Secondary | ICD-10-CM | POA: Diagnosis not present

## 2021-09-23 DIAGNOSIS — D519 Vitamin B12 deficiency anemia, unspecified: Secondary | ICD-10-CM | POA: Diagnosis not present

## 2021-09-30 DIAGNOSIS — D519 Vitamin B12 deficiency anemia, unspecified: Secondary | ICD-10-CM | POA: Diagnosis not present

## 2021-10-07 DIAGNOSIS — D519 Vitamin B12 deficiency anemia, unspecified: Secondary | ICD-10-CM | POA: Diagnosis not present

## 2021-10-27 DIAGNOSIS — H8113 Benign paroxysmal vertigo, bilateral: Secondary | ICD-10-CM | POA: Diagnosis not present

## 2021-10-27 DIAGNOSIS — S40011A Contusion of right shoulder, initial encounter: Secondary | ICD-10-CM | POA: Diagnosis not present

## 2021-10-27 DIAGNOSIS — S0093XA Contusion of unspecified part of head, initial encounter: Secondary | ICD-10-CM | POA: Diagnosis not present

## 2021-10-27 DIAGNOSIS — I4891 Unspecified atrial fibrillation: Secondary | ICD-10-CM | POA: Diagnosis not present

## 2021-10-27 DIAGNOSIS — I10 Essential (primary) hypertension: Secondary | ICD-10-CM | POA: Diagnosis not present

## 2021-10-27 DIAGNOSIS — M159 Polyosteoarthritis, unspecified: Secondary | ICD-10-CM | POA: Diagnosis not present

## 2021-10-29 DIAGNOSIS — L728 Other follicular cysts of the skin and subcutaneous tissue: Secondary | ICD-10-CM | POA: Diagnosis not present

## 2021-10-29 DIAGNOSIS — L739 Follicular disorder, unspecified: Secondary | ICD-10-CM | POA: Diagnosis not present

## 2021-10-29 DIAGNOSIS — L821 Other seborrheic keratosis: Secondary | ICD-10-CM | POA: Diagnosis not present

## 2021-11-03 DIAGNOSIS — I10 Essential (primary) hypertension: Secondary | ICD-10-CM | POA: Diagnosis not present

## 2021-11-03 DIAGNOSIS — D6869 Other thrombophilia: Secondary | ICD-10-CM | POA: Diagnosis not present

## 2021-11-03 DIAGNOSIS — Z7901 Long term (current) use of anticoagulants: Secondary | ICD-10-CM | POA: Diagnosis not present

## 2021-11-03 DIAGNOSIS — E039 Hypothyroidism, unspecified: Secondary | ICD-10-CM | POA: Diagnosis not present

## 2021-11-03 DIAGNOSIS — Z8249 Family history of ischemic heart disease and other diseases of the circulatory system: Secondary | ICD-10-CM | POA: Diagnosis not present

## 2021-11-03 DIAGNOSIS — Z823 Family history of stroke: Secondary | ICD-10-CM | POA: Diagnosis not present

## 2021-11-03 DIAGNOSIS — I4891 Unspecified atrial fibrillation: Secondary | ICD-10-CM | POA: Diagnosis not present

## 2021-11-03 DIAGNOSIS — K219 Gastro-esophageal reflux disease without esophagitis: Secondary | ICD-10-CM | POA: Diagnosis not present

## 2021-11-03 DIAGNOSIS — Z008 Encounter for other general examination: Secondary | ICD-10-CM | POA: Diagnosis not present

## 2021-11-03 DIAGNOSIS — M1990 Unspecified osteoarthritis, unspecified site: Secondary | ICD-10-CM | POA: Diagnosis not present

## 2021-11-03 DIAGNOSIS — Z87892 Personal history of anaphylaxis: Secondary | ICD-10-CM | POA: Diagnosis not present

## 2021-11-03 DIAGNOSIS — R69 Illness, unspecified: Secondary | ICD-10-CM | POA: Diagnosis not present

## 2021-11-03 DIAGNOSIS — E785 Hyperlipidemia, unspecified: Secondary | ICD-10-CM | POA: Diagnosis not present

## 2021-11-06 DIAGNOSIS — D519 Vitamin B12 deficiency anemia, unspecified: Secondary | ICD-10-CM | POA: Diagnosis not present

## 2021-11-13 DIAGNOSIS — N3592 Unspecified urethral stricture, female: Secondary | ICD-10-CM | POA: Diagnosis not present

## 2021-12-09 DIAGNOSIS — D519 Vitamin B12 deficiency anemia, unspecified: Secondary | ICD-10-CM | POA: Diagnosis not present

## 2021-12-11 DIAGNOSIS — N3592 Unspecified urethral stricture, female: Secondary | ICD-10-CM | POA: Diagnosis not present

## 2021-12-13 DIAGNOSIS — I10 Essential (primary) hypertension: Secondary | ICD-10-CM | POA: Diagnosis not present

## 2021-12-13 DIAGNOSIS — J069 Acute upper respiratory infection, unspecified: Secondary | ICD-10-CM | POA: Diagnosis not present

## 2021-12-13 DIAGNOSIS — R69 Illness, unspecified: Secondary | ICD-10-CM | POA: Diagnosis not present

## 2021-12-13 DIAGNOSIS — Z6825 Body mass index (BMI) 25.0-25.9, adult: Secondary | ICD-10-CM | POA: Diagnosis not present

## 2021-12-13 DIAGNOSIS — I4891 Unspecified atrial fibrillation: Secondary | ICD-10-CM | POA: Diagnosis not present

## 2021-12-13 DIAGNOSIS — E871 Hypo-osmolality and hyponatremia: Secondary | ICD-10-CM | POA: Diagnosis not present

## 2021-12-13 DIAGNOSIS — E559 Vitamin D deficiency, unspecified: Secondary | ICD-10-CM | POA: Diagnosis not present

## 2021-12-13 DIAGNOSIS — E039 Hypothyroidism, unspecified: Secondary | ICD-10-CM | POA: Diagnosis not present

## 2021-12-13 DIAGNOSIS — K219 Gastro-esophageal reflux disease without esophagitis: Secondary | ICD-10-CM | POA: Diagnosis not present

## 2021-12-13 DIAGNOSIS — D519 Vitamin B12 deficiency anemia, unspecified: Secondary | ICD-10-CM | POA: Diagnosis not present

## 2021-12-13 DIAGNOSIS — E785 Hyperlipidemia, unspecified: Secondary | ICD-10-CM | POA: Diagnosis not present

## 2021-12-15 DIAGNOSIS — I10 Essential (primary) hypertension: Secondary | ICD-10-CM | POA: Insufficient documentation

## 2021-12-22 ENCOUNTER — Ambulatory Visit: Payer: Medicare Other | Admitting: Cardiology

## 2022-01-02 DIAGNOSIS — M545 Low back pain, unspecified: Secondary | ICD-10-CM | POA: Diagnosis not present

## 2022-01-02 DIAGNOSIS — R519 Headache, unspecified: Secondary | ICD-10-CM | POA: Diagnosis not present

## 2022-01-02 DIAGNOSIS — G8929 Other chronic pain: Secondary | ICD-10-CM | POA: Diagnosis not present

## 2022-01-02 DIAGNOSIS — N390 Urinary tract infection, site not specified: Secondary | ICD-10-CM | POA: Diagnosis not present

## 2022-01-02 DIAGNOSIS — E559 Vitamin D deficiency, unspecified: Secondary | ICD-10-CM | POA: Diagnosis not present

## 2022-01-09 DIAGNOSIS — D519 Vitamin B12 deficiency anemia, unspecified: Secondary | ICD-10-CM | POA: Diagnosis not present

## 2022-01-13 DIAGNOSIS — N3592 Unspecified urethral stricture, female: Secondary | ICD-10-CM | POA: Diagnosis not present

## 2022-01-17 NOTE — Progress Notes (Unsigned)
Cardiology Office Note:    Date:  01/19/2022   ID:  [name], DOB 1940/10/25, MRN [id number]  PCP:  Sydney Dress, MD  Cardiologist:  Sydney More, MD    Referring MD: Sydney Dress, MD    ASSESSMENT:    1. Hypertensive heart disease without heart failure   2. Paroxysmal atrial fibrillation (HCC)   3. Chronic anticoagulation   4. Other fatigue    PLAN:    In order of problems listed above:  Cardiology perspective she is doing well blood pressure is controlled continue current regimen including calcium channel blocker ARB and continue to trend home blood pressure.  Standing her blood pressure today is 140/70 Stable maintaining sinus rhythm continue anticoagulant I told her I think this to signals that she may be having side effects of Xanax the episode of dizziness and weakness in the emergency room and her fatigue during the day and I challenged her to cut back on her limitations taking a daily.   Next appointment: 9 months   Medication Adjustments/Labs and Tests Ordered: Current medicines are reviewed at length with the patient today.  Concerns regarding medicines are outlined above.  No orders of the defined types were placed in this encounter.  No orders of the defined types were placed in this encounter.   Chief Complaint  Patient presents with   Follow-up   Atrial Fibrillation    History of Present Illness:    [name] is a 81 y.o. female with a hx of paroxysmal atrial fibrillation with chronic anticoagulation hypertensive heart disease atrial fibrillation with multiple EP procedures including pulmonary vein isolation 2010 and 2012 last seen 06/20/2021 following a recent emergency room visit with hypertensive urgency maintaining sinus rhythm.. Compliance with diet, lifestyle and medications: Yes  From a cardiology perspective doing well no recurrence of rapid heart rhythm tolerates her anticoagulant without bleeding he is on a statin without  muscle pain or weakness He has had no edema shortness of breath chest pain palpitation or syncope  Recent labs 12/13/2021 are good cholesterol 194 LDL 99 hemoglobin 14.5 creatinine 0.74 potassium 4.1 TSH normal 1.88  He was seen at [facility] ED with dizziness She has had episodes or signs or symptoms to return the day and has to stop and rest She takes Xanax at bedtime.  Home blood pressures run less than 301 /601 systolic Past Medical History:  Diagnosis Date    Possible Neuropathy 04/16/2015   Adverse effect of drug 04/16/2015   Anesthesia to pain 03/02/2017   Overview:  reaction to Protamine with 2nd cardiac ablation.   Anticoagulant long-term use    ELIQUIS   Anticoagulated 06/20/2016   Anxiety 12/05/2015   Atrial fibrillation ([place]) 01/10/2014   Atrial flutter by electrocardiogram ([place]) 06/06/2014   Atrial flutter, paroxysmal (HCC)    Atypical atrial flutter ([place]) 10/21/2016   Bradycardia 02/28/2021   Carotid atherosclerosis 04/21/2018   Chronic anticoagulation 02/13/2015   Congenital anomaly of pulmonary vein    per Cardiac MRI done at [facility] 02-16-2014-- noted 5 pulmonary veins (3 on the right and 2 on the left ) to left atrium   Congestive heart failure ([place]) 01/10/2014   Current use of long term anticoagulation 02/13/2015   Drug reaction to Amoxicillin 04/16/2015   Eczema 04/16/2015   Encounter for monitoring sotalol therapy 06/06/2014   Encounter for therapeutic drug level monitoring 06/06/2014   Esophageal reflux 01/10/2014   First degree heart block    Gastroesophageal reflux disease 01/10/2014  GERD (gastroesophageal reflux disease)    High risk medication use 02/13/2015   Overview:  Ticosyn   History of amiodarone therapy 06/20/2016   History of diverticulitis 06/25/2016   History of diverticulitis of colon    04/ 2017   Hyperlipidemia    Hypertension    Hyponatremia 05/17/2016   Hypothyroid 12/05/2015   Hypothyroidism    Hypothyroidism (acquired)  01/10/2014   Long term current use of antiarrhythmic drug 10/15/2016   Mild CAD 01/17/2019   On amiodarone therapy 03/23/2016   Ovarian cyst 12/05/2015   PAF (paroxysmal atrial fibrillation) [facility]) cardiologist- dr [name])   Paroxysmal atrial fibrillation ([place]) 02/13/2015   Overview:  had pulmonary vein ablation for AF in 2010 and redo for recurrence in April 2012  * CHADS2 score=2  Overview:  had pulmonary vein ablation for AF in 2010 and redo for recurrence in April 2012  * CHADS2 score=2   PAT (paroxysmal atrial tachycardia) (HCC)    Pre-operative cardiovascular examination 03/03/2017   Rectocele 12/05/2015   S/P ablation of atrial fibrillation 2011  &  2012  &  02-19-2014 at [place] cardiologist-- dr Norm Salt ([place])   Systolic CHF [facility])    Urethral stricture    Ventral hernia 05/17/2016   Wears glasses     Past Surgical History:  Procedure Laterality Date   ABDOMINAL HERNIA REPAIR  10/ 2017   at [place]  and [place]  06/1985   ANTERIOR REPAIR AND TRANSOBTURATOR SLING  09/08/2010   CYSTOCELE AND SUI   CARDIAC CATHETERIZATION  1991 ([place]) &  2006 (dr Lia Foyer)   lvf preserved/ 45-50% mid rca/  40% lad after takeoff diagonal branch   CARDIAC ELECTROPHYSIOLOGY STUDY AND ABLATION  x3 2011  &  2012  ([facility]) and 02-19-2014 at [place]   per EP study report 02-19-2014-- re-do PVI (LIPV & RSPV re-isolated) left atrial roof line & CTI line performed   CARDIOVASCULAR STRESS TEST  04/08/2009   normal study/ ef 65%   CARDIOVERSION  last one 09-01-2016  in [place] Bilateral    CYSTOSCOPY WITH URETHRAL DILATATION N/A 12/23/2012   Procedure: CYSTOSCOPY WITH BALLOON DILATATION;  Surgeon: Claybon Jabs, MD;  Location: [facility];  Service: Urology;  Laterality: N/A;   RECTOCELE REPAIR  03/23/2017   TRANSESOPHAGEAL ECHOCARDIOGRAM  05/20/2009   normal lvf and structure/ ef 60-65%/ no thrombus    TUBAL LIGATION  1978    Current Medications: Current Meds  Medication Sig   acetaminophen (TYLENOL) 500 MG tablet Take 1,000 mg by mouth every 6 (six) hours as needed for mild pain or moderate pain.   ALPRAZolam (XANAX) 0.25 MG tablet Take 0.25 mg by mouth 3 (three) times daily as needed for anxiety.   diltiazem (CARDIZEM CD) 180 MG 24 hr capsule Take 180 mg by mouth 2 (two) times daily.   ELIQUIS 5 MG TABS tablet Take 5 mg by mouth 2 (two) times daily.   famotidine (PEPCID) 40 MG tablet Take 40 mg by mouth at bedtime.   levothyroxine (SYNTHROID) 75 MCG tablet Take 75 mcg by mouth daily before breakfast.   loratadine (CLARITIN) 10 MG tablet Take 10 mg by mouth as needed for allergies.   losartan (COZAAR) 50 MG tablet Take 50 mg by mouth daily.   ondansetron (ZOFRAN-ODT) 4 MG disintegrating tablet Take 4 mg by mouth 4 (four) times daily as needed for nausea or vomiting.  pantoprazole (PROTONIX) 40 MG tablet Take 40 mg by mouth daily.   simvastatin (ZOCOR) 40 MG tablet Take 40 mg by mouth at bedtime.     Allergies:   Amiodarone, Protamine, Amoxicillin, Celebrex [celecoxib], Clindamycin, Ace inhibitors, Cleocin [clindamycin hcl], and Diflucan [fluconazole]   Social History   Socioeconomic History   Marital status: Widowed    Spouse name: Not on file   Number of children: 4   Years of education: Not on file   Highest education level: Some college, no degree  Occupational History   Occupation: retired  Tobacco Use   Smoking status: Former    Packs/day: 0.75    Years: 30.00    Total pack years: 22.50    Types: Cigarettes    Quit date: 12/21/2004    Years since quitting: 17.0   Smokeless tobacco: Never  Vaping Use   Vaping Use: Never used  Substance and Sexual Activity   Alcohol use: No   Drug use: No   Sexual activity: Not on file  Other Topics Concern   Not on file  Social History Narrative   Patient is right-handed. She lives in a one level home. She drinks two 8 oz  glasses of tea a day, an occasional soda and rarely coffee.    Social Determinants of Health   Financial Resource Strain: Not on file  Food Insecurity: Not on file  Transportation Needs: Not on file  Physical Activity: Not on file  Stress: Not on file  Social Connections: Not on file     Family History: The patient's family history includes Congestive Heart Failure in her mother; Diabetes in her maternal grandfather and son; Heart attack in her father and sister; Heart disease in her father; Hypertension in her mother; Liver cancer in her maternal grandfather and maternal grandmother; Stroke in her mother and paternal grandmother. ROS:   Please see the history of present illness.    All other systems reviewed and are negative.  EKGs/Labs/Other Studies Reviewed:    The following studies were reviewed today:   Physical Exam:    VS:  BP (!) 148/68 (BP Location: Left Arm, Patient Position: Sitting)   Pulse 64   Ht '5\' 1"'$  (1.549 m)   Wt 137 lb 9.6 oz (62.4 kg)   SpO2 96%   BMI 26.00 kg/m     Wt Readings from Last 3 Encounters:  01/19/22 137 lb 9.6 oz (62.4 kg)  06/20/21 138 lb 6.4 oz (62.8 kg)  02/28/21 138 lb 9.6 oz (62.9 kg)     GEN:  Well nourished, well developed in no acute distress HEENT: Normal NECK: No JVD; No carotid bruits LYMPHATICS: No lymphadenopathy CARDIAC: RRR, no murmurs, rubs, gallops RESPIRATORY:  Clear to auscultation without rales, wheezing or rhonchi  ABDOMEN: Soft, non-tender, non-distended MUSCULOSKELETAL:  No edema; No deformity  SKIN: Warm and dry NEUROLOGIC:  Robertson and oriented x 3 PSYCHIATRIC:  Normal affect    Signed, Sydney More, MD  01/19/2022 1:08 PM    [facility]

## 2022-01-19 ENCOUNTER — Ambulatory Visit: Payer: Medicare HMO | Admitting: Cardiology

## 2022-01-19 ENCOUNTER — Encounter: Payer: Self-pay | Admitting: Cardiology

## 2022-01-19 VITALS — BP 140/90 | HR 64 | Ht 61.0 in | Wt 137.6 lb

## 2022-01-19 DIAGNOSIS — G5793 Unspecified mononeuropathy of bilateral lower limbs: Secondary | ICD-10-CM | POA: Diagnosis not present

## 2022-01-19 DIAGNOSIS — Z7901 Long term (current) use of anticoagulants: Secondary | ICD-10-CM

## 2022-01-19 DIAGNOSIS — E782 Mixed hyperlipidemia: Secondary | ICD-10-CM | POA: Diagnosis not present

## 2022-01-19 DIAGNOSIS — E039 Hypothyroidism, unspecified: Secondary | ICD-10-CM | POA: Diagnosis not present

## 2022-01-19 DIAGNOSIS — G629 Polyneuropathy, unspecified: Secondary | ICD-10-CM | POA: Diagnosis not present

## 2022-01-19 DIAGNOSIS — R69 Illness, unspecified: Secondary | ICD-10-CM | POA: Diagnosis not present

## 2022-01-19 DIAGNOSIS — I119 Hypertensive heart disease without heart failure: Secondary | ICD-10-CM

## 2022-01-19 DIAGNOSIS — R5383 Other fatigue: Secondary | ICD-10-CM | POA: Diagnosis not present

## 2022-01-19 DIAGNOSIS — E871 Hypo-osmolality and hyponatremia: Secondary | ICD-10-CM | POA: Diagnosis not present

## 2022-01-19 DIAGNOSIS — N3592 Unspecified urethral stricture, female: Secondary | ICD-10-CM | POA: Diagnosis not present

## 2022-01-19 DIAGNOSIS — I480 Paroxysmal atrial fibrillation: Secondary | ICD-10-CM | POA: Diagnosis not present

## 2022-01-19 DIAGNOSIS — I471 Supraventricular tachycardia: Secondary | ICD-10-CM | POA: Diagnosis not present

## 2022-01-19 DIAGNOSIS — I4891 Unspecified atrial fibrillation: Secondary | ICD-10-CM | POA: Diagnosis not present

## 2022-01-19 DIAGNOSIS — K219 Gastro-esophageal reflux disease without esophagitis: Secondary | ICD-10-CM | POA: Diagnosis not present

## 2022-01-19 NOTE — Patient Instructions (Signed)
Medication Instructions:  Your physician recommends that you continue on your current medications as directed. Please refer to the Current Medication list given to you today.  *If you need a refill on your cardiac medications before your next appointment, please call your pharmacy*   Lab Work: None If you have labs (blood work) drawn today and your tests are completely normal, you will receive your results only by: [place] (if you have MyChart) OR A paper copy in the mail If you have any lab test that is abnormal or we need to change your treatment, we will call you to review the results.   Testing/Procedures: None   Follow-Up: At [facility], you and your health needs are our priority.  As part of our continuing mission to provide you with exceptional heart care, we have created designated Provider Care Teams.  These Care Teams include your primary Cardiologist (physician) and Advanced Practice Providers (APPs -  Physician Assistants and Nurse Practitioners) who all work together to provide you with the care you need, when you need it.  We recommend signing up for the patient portal called "MyChart".  Sign up information is provided on this After Visit Summary.  MyChart is used to connect with patients for Virtual Visits (Telemedicine).  Patients are able to view lab/test results, encounter notes, upcoming appointments, etc.  Non-urgent messages can be sent to your provider as well.   To learn more about what you can do with MyChart, go to [email or web address].    Your next appointment:   9 month(s)  The format for your next appointment:   In Person  Provider:   Shirlee More, MD    Other Instructions Try to limit or decrease Alprazolam.  Important Information About Sugar

## 2022-02-02 DIAGNOSIS — R519 Headache, unspecified: Secondary | ICD-10-CM | POA: Diagnosis not present

## 2022-02-02 DIAGNOSIS — R42 Dizziness and giddiness: Secondary | ICD-10-CM | POA: Diagnosis not present

## 2022-02-05 DIAGNOSIS — H02102 Unspecified ectropion of right lower eyelid: Secondary | ICD-10-CM | POA: Diagnosis not present

## 2022-02-05 DIAGNOSIS — Z961 Presence of intraocular lens: Secondary | ICD-10-CM | POA: Diagnosis not present

## 2022-02-05 DIAGNOSIS — H10829 Rosacea conjunctivitis, unspecified eye: Secondary | ICD-10-CM | POA: Diagnosis not present

## 2022-02-05 DIAGNOSIS — H18593 Other hereditary corneal dystrophies, bilateral: Secondary | ICD-10-CM | POA: Diagnosis not present

## 2022-02-05 DIAGNOSIS — H524 Presbyopia: Secondary | ICD-10-CM | POA: Diagnosis not present

## 2022-02-05 DIAGNOSIS — H04123 Dry eye syndrome of bilateral lacrimal glands: Secondary | ICD-10-CM | POA: Diagnosis not present

## 2022-02-09 DIAGNOSIS — M150 Primary generalized (osteo)arthritis: Secondary | ICD-10-CM

## 2022-02-09 DIAGNOSIS — M159 Polyosteoarthritis, unspecified: Secondary | ICD-10-CM | POA: Diagnosis not present

## 2022-02-09 DIAGNOSIS — E538 Deficiency of other specified B group vitamins: Secondary | ICD-10-CM | POA: Diagnosis not present

## 2022-02-09 HISTORY — DX: Primary generalized (osteo)arthritis: M15.0

## 2022-02-13 ENCOUNTER — Telehealth: Payer: Self-pay

## 2022-02-13 NOTE — Telephone Encounter (Signed)
Please disregard error

## 2022-02-17 DIAGNOSIS — N3592 Unspecified urethral stricture, female: Secondary | ICD-10-CM | POA: Diagnosis not present

## 2022-03-12 DIAGNOSIS — E538 Deficiency of other specified B group vitamins: Secondary | ICD-10-CM | POA: Diagnosis not present

## 2022-03-20 DIAGNOSIS — N3592 Unspecified urethral stricture, female: Secondary | ICD-10-CM | POA: Diagnosis not present

## 2022-04-15 DIAGNOSIS — E538 Deficiency of other specified B group vitamins: Secondary | ICD-10-CM | POA: Diagnosis not present

## 2022-04-21 DIAGNOSIS — N3592 Unspecified urethral stricture, female: Secondary | ICD-10-CM | POA: Diagnosis not present

## 2022-05-11 DIAGNOSIS — G629 Polyneuropathy, unspecified: Secondary | ICD-10-CM | POA: Diagnosis not present

## 2022-05-11 DIAGNOSIS — I4719 Other supraventricular tachycardia: Secondary | ICD-10-CM | POA: Diagnosis not present

## 2022-05-11 DIAGNOSIS — L84 Corns and callosities: Secondary | ICD-10-CM

## 2022-05-11 DIAGNOSIS — Z79899 Other long term (current) drug therapy: Secondary | ICD-10-CM | POA: Diagnosis not present

## 2022-05-11 DIAGNOSIS — K219 Gastro-esophageal reflux disease without esophagitis: Secondary | ICD-10-CM | POA: Diagnosis not present

## 2022-05-11 DIAGNOSIS — N816 Rectocele: Secondary | ICD-10-CM | POA: Diagnosis not present

## 2022-05-11 DIAGNOSIS — E782 Mixed hyperlipidemia: Secondary | ICD-10-CM | POA: Diagnosis not present

## 2022-05-11 DIAGNOSIS — E538 Deficiency of other specified B group vitamins: Secondary | ICD-10-CM | POA: Diagnosis not present

## 2022-05-11 DIAGNOSIS — Z7901 Long term (current) use of anticoagulants: Secondary | ICD-10-CM | POA: Diagnosis not present

## 2022-05-11 DIAGNOSIS — R5381 Other malaise: Secondary | ICD-10-CM | POA: Insufficient documentation

## 2022-05-11 DIAGNOSIS — E039 Hypothyroidism, unspecified: Secondary | ICD-10-CM | POA: Diagnosis not present

## 2022-05-11 DIAGNOSIS — Z79890 Hormone replacement therapy (postmenopausal): Secondary | ICD-10-CM | POA: Diagnosis not present

## 2022-05-11 DIAGNOSIS — F419 Anxiety disorder, unspecified: Secondary | ICD-10-CM | POA: Diagnosis not present

## 2022-05-11 DIAGNOSIS — Z87891 Personal history of nicotine dependence: Secondary | ICD-10-CM | POA: Diagnosis not present

## 2022-05-11 DIAGNOSIS — I5033 Acute on chronic diastolic (congestive) heart failure: Secondary | ICD-10-CM | POA: Diagnosis not present

## 2022-05-11 DIAGNOSIS — I5042 Chronic combined systolic (congestive) and diastolic (congestive) heart failure: Secondary | ICD-10-CM | POA: Diagnosis not present

## 2022-05-11 DIAGNOSIS — I110 Hypertensive heart disease with heart failure: Secondary | ICD-10-CM | POA: Diagnosis not present

## 2022-05-11 DIAGNOSIS — R5383 Other fatigue: Secondary | ICD-10-CM | POA: Diagnosis not present

## 2022-05-11 DIAGNOSIS — Z1329 Encounter for screening for other suspected endocrine disorder: Secondary | ICD-10-CM | POA: Diagnosis not present

## 2022-05-11 DIAGNOSIS — Z13220 Encounter for screening for lipoid disorders: Secondary | ICD-10-CM | POA: Diagnosis not present

## 2022-05-11 DIAGNOSIS — Z0000 Encounter for general adult medical examination without abnormal findings: Secondary | ICD-10-CM | POA: Diagnosis not present

## 2022-05-11 DIAGNOSIS — Z789 Other specified health status: Secondary | ICD-10-CM | POA: Diagnosis not present

## 2022-05-11 DIAGNOSIS — M159 Polyosteoarthritis, unspecified: Secondary | ICD-10-CM | POA: Diagnosis not present

## 2022-05-11 DIAGNOSIS — R69 Illness, unspecified: Secondary | ICD-10-CM | POA: Diagnosis not present

## 2022-05-11 HISTORY — DX: Corns and callosities: L84

## 2022-05-11 HISTORY — DX: Other malaise: R53.81

## 2022-05-19 DIAGNOSIS — N3592 Unspecified urethral stricture, female: Secondary | ICD-10-CM | POA: Diagnosis not present

## 2022-05-25 ENCOUNTER — Ambulatory Visit: Payer: Medicare HMO | Admitting: Podiatry

## 2022-06-19 DIAGNOSIS — E538 Deficiency of other specified B group vitamins: Secondary | ICD-10-CM | POA: Diagnosis not present

## 2022-06-24 DIAGNOSIS — K642 Third degree hemorrhoids: Secondary | ICD-10-CM | POA: Diagnosis not present

## 2022-06-29 DIAGNOSIS — F419 Anxiety disorder, unspecified: Secondary | ICD-10-CM | POA: Diagnosis not present

## 2022-06-29 DIAGNOSIS — R69 Illness, unspecified: Secondary | ICD-10-CM | POA: Diagnosis not present

## 2022-06-29 DIAGNOSIS — N816 Rectocele: Secondary | ICD-10-CM | POA: Diagnosis not present

## 2022-07-20 DIAGNOSIS — N3592 Unspecified urethral stricture, female: Secondary | ICD-10-CM | POA: Diagnosis not present

## 2022-07-20 DIAGNOSIS — E538 Deficiency of other specified B group vitamins: Secondary | ICD-10-CM | POA: Diagnosis not present

## 2022-07-24 DIAGNOSIS — K648 Other hemorrhoids: Secondary | ICD-10-CM | POA: Diagnosis not present

## 2022-07-24 DIAGNOSIS — K644 Residual hemorrhoidal skin tags: Secondary | ICD-10-CM | POA: Diagnosis not present

## 2022-07-28 DIAGNOSIS — K219 Gastro-esophageal reflux disease without esophagitis: Secondary | ICD-10-CM | POA: Diagnosis not present

## 2022-08-11 DIAGNOSIS — E538 Deficiency of other specified B group vitamins: Secondary | ICD-10-CM

## 2022-08-11 DIAGNOSIS — R69 Illness, unspecified: Secondary | ICD-10-CM | POA: Diagnosis not present

## 2022-08-11 DIAGNOSIS — F32A Depression, unspecified: Secondary | ICD-10-CM | POA: Insufficient documentation

## 2022-08-11 DIAGNOSIS — E871 Hypo-osmolality and hyponatremia: Secondary | ICD-10-CM | POA: Diagnosis not present

## 2022-08-11 DIAGNOSIS — E782 Mixed hyperlipidemia: Secondary | ICD-10-CM | POA: Diagnosis not present

## 2022-08-11 DIAGNOSIS — M159 Polyosteoarthritis, unspecified: Secondary | ICD-10-CM | POA: Diagnosis not present

## 2022-08-11 DIAGNOSIS — K219 Gastro-esophageal reflux disease without esophagitis: Secondary | ICD-10-CM | POA: Diagnosis not present

## 2022-08-11 DIAGNOSIS — Z7901 Long term (current) use of anticoagulants: Secondary | ICD-10-CM | POA: Diagnosis not present

## 2022-08-11 DIAGNOSIS — I480 Paroxysmal atrial fibrillation: Secondary | ICD-10-CM | POA: Diagnosis not present

## 2022-08-11 DIAGNOSIS — E039 Hypothyroidism, unspecified: Secondary | ICD-10-CM | POA: Diagnosis not present

## 2022-08-11 HISTORY — DX: Deficiency of other specified B group vitamins: E53.8

## 2022-08-11 HISTORY — DX: Depression, unspecified: F32.A

## 2022-08-20 DIAGNOSIS — N3592 Unspecified urethral stricture, female: Secondary | ICD-10-CM | POA: Diagnosis not present

## 2022-08-20 DIAGNOSIS — E538 Deficiency of other specified B group vitamins: Secondary | ICD-10-CM | POA: Diagnosis not present

## 2022-09-04 DIAGNOSIS — N3001 Acute cystitis with hematuria: Secondary | ICD-10-CM | POA: Diagnosis not present

## 2022-09-21 DIAGNOSIS — N3592 Unspecified urethral stricture, female: Secondary | ICD-10-CM | POA: Diagnosis not present

## 2022-09-21 DIAGNOSIS — E538 Deficiency of other specified B group vitamins: Secondary | ICD-10-CM | POA: Diagnosis not present

## 2022-10-07 DIAGNOSIS — M159 Polyosteoarthritis, unspecified: Secondary | ICD-10-CM | POA: Diagnosis not present

## 2022-10-20 DIAGNOSIS — E538 Deficiency of other specified B group vitamins: Secondary | ICD-10-CM | POA: Diagnosis not present

## 2022-10-20 DIAGNOSIS — N3592 Unspecified urethral stricture, female: Secondary | ICD-10-CM | POA: Diagnosis not present

## 2022-10-20 DIAGNOSIS — H04123 Dry eye syndrome of bilateral lacrimal glands: Secondary | ICD-10-CM | POA: Diagnosis not present

## 2022-10-20 DIAGNOSIS — H18593 Other hereditary corneal dystrophies, bilateral: Secondary | ICD-10-CM | POA: Diagnosis not present

## 2022-10-20 DIAGNOSIS — H02102 Unspecified ectropion of right lower eyelid: Secondary | ICD-10-CM | POA: Diagnosis not present

## 2022-10-20 DIAGNOSIS — H10829 Rosacea conjunctivitis, unspecified eye: Secondary | ICD-10-CM | POA: Diagnosis not present

## 2022-11-03 DIAGNOSIS — H02102 Unspecified ectropion of right lower eyelid: Secondary | ICD-10-CM | POA: Diagnosis not present

## 2022-11-03 DIAGNOSIS — H04123 Dry eye syndrome of bilateral lacrimal glands: Secondary | ICD-10-CM | POA: Diagnosis not present

## 2022-11-03 DIAGNOSIS — H10829 Rosacea conjunctivitis, unspecified eye: Secondary | ICD-10-CM | POA: Diagnosis not present

## 2022-11-03 DIAGNOSIS — H18593 Other hereditary corneal dystrophies, bilateral: Secondary | ICD-10-CM | POA: Diagnosis not present

## 2022-11-10 DIAGNOSIS — R5381 Other malaise: Secondary | ICD-10-CM | POA: Diagnosis not present

## 2022-11-10 DIAGNOSIS — I480 Paroxysmal atrial fibrillation: Secondary | ICD-10-CM | POA: Diagnosis not present

## 2022-11-10 DIAGNOSIS — F419 Anxiety disorder, unspecified: Secondary | ICD-10-CM | POA: Diagnosis not present

## 2022-11-10 DIAGNOSIS — R69 Illness, unspecified: Secondary | ICD-10-CM | POA: Diagnosis not present

## 2022-11-10 DIAGNOSIS — R040 Epistaxis: Secondary | ICD-10-CM | POA: Insufficient documentation

## 2022-11-10 DIAGNOSIS — K219 Gastro-esophageal reflux disease without esophagitis: Secondary | ICD-10-CM | POA: Diagnosis not present

## 2022-11-10 DIAGNOSIS — M159 Polyosteoarthritis, unspecified: Secondary | ICD-10-CM | POA: Diagnosis not present

## 2022-11-10 DIAGNOSIS — F32A Depression, unspecified: Secondary | ICD-10-CM | POA: Diagnosis not present

## 2022-11-10 DIAGNOSIS — G629 Polyneuropathy, unspecified: Secondary | ICD-10-CM | POA: Diagnosis not present

## 2022-11-10 DIAGNOSIS — E538 Deficiency of other specified B group vitamins: Secondary | ICD-10-CM | POA: Diagnosis not present

## 2022-11-10 DIAGNOSIS — Z7901 Long term (current) use of anticoagulants: Secondary | ICD-10-CM | POA: Diagnosis not present

## 2022-11-10 DIAGNOSIS — E782 Mixed hyperlipidemia: Secondary | ICD-10-CM | POA: Diagnosis not present

## 2022-11-10 DIAGNOSIS — E039 Hypothyroidism, unspecified: Secondary | ICD-10-CM | POA: Diagnosis not present

## 2022-11-10 HISTORY — DX: Epistaxis: R04.0

## 2022-11-20 DIAGNOSIS — E538 Deficiency of other specified B group vitamins: Secondary | ICD-10-CM | POA: Diagnosis not present

## 2022-11-23 ENCOUNTER — Ambulatory Visit: Payer: Medicare HMO | Admitting: Cardiology

## 2022-11-30 ENCOUNTER — Telehealth: Payer: Self-pay | Admitting: Cardiology

## 2022-11-30 DIAGNOSIS — Z7901 Long term (current) use of anticoagulants: Secondary | ICD-10-CM | POA: Diagnosis not present

## 2022-11-30 DIAGNOSIS — M1990 Unspecified osteoarthritis, unspecified site: Secondary | ICD-10-CM | POA: Diagnosis not present

## 2022-11-30 DIAGNOSIS — I4891 Unspecified atrial fibrillation: Secondary | ICD-10-CM | POA: Diagnosis not present

## 2022-11-30 DIAGNOSIS — E039 Hypothyroidism, unspecified: Secondary | ICD-10-CM | POA: Diagnosis not present

## 2022-11-30 DIAGNOSIS — K219 Gastro-esophageal reflux disease without esophagitis: Secondary | ICD-10-CM | POA: Diagnosis not present

## 2022-11-30 DIAGNOSIS — R69 Illness, unspecified: Secondary | ICD-10-CM | POA: Diagnosis not present

## 2022-11-30 DIAGNOSIS — I10 Essential (primary) hypertension: Secondary | ICD-10-CM | POA: Diagnosis not present

## 2022-11-30 DIAGNOSIS — Z888 Allergy status to other drugs, medicaments and biological substances status: Secondary | ICD-10-CM | POA: Diagnosis not present

## 2022-11-30 DIAGNOSIS — Z87891 Personal history of nicotine dependence: Secondary | ICD-10-CM | POA: Diagnosis not present

## 2022-11-30 DIAGNOSIS — D6869 Other thrombophilia: Secondary | ICD-10-CM | POA: Diagnosis not present

## 2022-11-30 DIAGNOSIS — E785 Hyperlipidemia, unspecified: Secondary | ICD-10-CM | POA: Diagnosis not present

## 2022-11-30 NOTE — Telephone Encounter (Signed)
Called patient and informed her of Dr. Hulen Shouts recommendation below:  "I think she should keep her scheduled appointment"   Patient was agreeable with this plan and had no further questions at this time.

## 2022-11-30 NOTE — Telephone Encounter (Signed)
Patient called in requesting a sooner appt due to recent weak spells. She was scheduled with NP Victorino Dike for the first available 05/14 and kept the appt in July with Dr. Dulce Sellar. Patient was still concerned with 05/14 being too far out. She reports a home health nurse came out today and her BP and everything looked fine. She plans to call her PCP later today in regards to this as well, but wants to be seen by our office to confirm it's not a blockage. Please advise.

## 2022-11-30 NOTE — Telephone Encounter (Signed)
Called patient and she reported that last week she had multiples times where she felt generalized weakness after breakfast and then after a couple of hours the weakness would resolve and she would feel better. She has no other symptoms at this time. A home health nurse came out to her house today and checked her blood pressure and heart rate and told her it was fine. She was going to contact her PCP regarding her weakness, but wanted to check with Dr. Dulce Sellar as well. Please advise

## 2022-12-02 DIAGNOSIS — Z87891 Personal history of nicotine dependence: Secondary | ICD-10-CM | POA: Diagnosis not present

## 2022-12-02 DIAGNOSIS — R5383 Other fatigue: Secondary | ICD-10-CM | POA: Diagnosis not present

## 2022-12-02 DIAGNOSIS — I480 Paroxysmal atrial fibrillation: Secondary | ICD-10-CM | POA: Diagnosis not present

## 2022-12-02 DIAGNOSIS — E538 Deficiency of other specified B group vitamins: Secondary | ICD-10-CM | POA: Diagnosis not present

## 2022-12-02 DIAGNOSIS — E039 Hypothyroidism, unspecified: Secondary | ICD-10-CM | POA: Diagnosis not present

## 2022-12-02 DIAGNOSIS — R5381 Other malaise: Secondary | ICD-10-CM | POA: Diagnosis not present

## 2022-12-11 DIAGNOSIS — R5381 Other malaise: Secondary | ICD-10-CM | POA: Diagnosis not present

## 2022-12-11 DIAGNOSIS — S81811D Laceration without foreign body, right lower leg, subsequent encounter: Secondary | ICD-10-CM | POA: Diagnosis not present

## 2022-12-11 DIAGNOSIS — R5383 Other fatigue: Secondary | ICD-10-CM | POA: Diagnosis not present

## 2022-12-11 DIAGNOSIS — E871 Hypo-osmolality and hyponatremia: Secondary | ICD-10-CM | POA: Diagnosis not present

## 2022-12-21 DIAGNOSIS — N3592 Unspecified urethral stricture, female: Secondary | ICD-10-CM | POA: Diagnosis not present

## 2022-12-21 DIAGNOSIS — E538 Deficiency of other specified B group vitamins: Secondary | ICD-10-CM | POA: Diagnosis not present

## 2022-12-22 ENCOUNTER — Ambulatory Visit: Payer: Medicare Other | Admitting: Cardiology

## 2022-12-28 DIAGNOSIS — G9332 Myalgic encephalomyelitis/chronic fatigue syndrome: Secondary | ICD-10-CM | POA: Diagnosis not present

## 2022-12-28 DIAGNOSIS — R5383 Other fatigue: Secondary | ICD-10-CM | POA: Diagnosis not present

## 2022-12-28 DIAGNOSIS — R5381 Other malaise: Secondary | ICD-10-CM | POA: Diagnosis not present

## 2022-12-29 DIAGNOSIS — R5381 Other malaise: Secondary | ICD-10-CM | POA: Diagnosis not present

## 2022-12-29 DIAGNOSIS — R5383 Other fatigue: Secondary | ICD-10-CM | POA: Diagnosis not present

## 2023-01-22 DIAGNOSIS — J209 Acute bronchitis, unspecified: Secondary | ICD-10-CM | POA: Diagnosis not present

## 2023-01-22 DIAGNOSIS — J204 Acute bronchitis due to parainfluenza virus: Secondary | ICD-10-CM | POA: Diagnosis not present

## 2023-01-22 DIAGNOSIS — J9690 Respiratory failure, unspecified, unspecified whether with hypoxia or hypercapnia: Secondary | ICD-10-CM | POA: Diagnosis not present

## 2023-01-22 DIAGNOSIS — I482 Chronic atrial fibrillation, unspecified: Secondary | ICD-10-CM | POA: Diagnosis not present

## 2023-01-22 DIAGNOSIS — T380X5A Adverse effect of glucocorticoids and synthetic analogues, initial encounter: Secondary | ICD-10-CM | POA: Diagnosis not present

## 2023-01-22 DIAGNOSIS — J189 Pneumonia, unspecified organism: Secondary | ICD-10-CM | POA: Diagnosis not present

## 2023-01-22 DIAGNOSIS — J811 Chronic pulmonary edema: Secondary | ICD-10-CM | POA: Diagnosis not present

## 2023-01-22 DIAGNOSIS — E876 Hypokalemia: Secondary | ICD-10-CM | POA: Diagnosis not present

## 2023-01-22 DIAGNOSIS — J181 Lobar pneumonia, unspecified organism: Secondary | ICD-10-CM | POA: Diagnosis not present

## 2023-01-22 DIAGNOSIS — R0602 Shortness of breath: Secondary | ICD-10-CM | POA: Diagnosis not present

## 2023-01-22 DIAGNOSIS — I10 Essential (primary) hypertension: Secondary | ICD-10-CM | POA: Diagnosis not present

## 2023-01-22 DIAGNOSIS — I700 Atherosclerosis of aorta: Secondary | ICD-10-CM | POA: Diagnosis not present

## 2023-01-22 DIAGNOSIS — J9811 Atelectasis: Secondary | ICD-10-CM | POA: Diagnosis not present

## 2023-01-22 DIAGNOSIS — R531 Weakness: Secondary | ICD-10-CM | POA: Diagnosis not present

## 2023-01-22 DIAGNOSIS — R059 Cough, unspecified: Secondary | ICD-10-CM | POA: Diagnosis not present

## 2023-01-22 DIAGNOSIS — R001 Bradycardia, unspecified: Secondary | ICD-10-CM | POA: Diagnosis not present

## 2023-01-22 DIAGNOSIS — J9601 Acute respiratory failure with hypoxia: Secondary | ICD-10-CM | POA: Diagnosis not present

## 2023-01-22 DIAGNOSIS — Z452 Encounter for adjustment and management of vascular access device: Secondary | ICD-10-CM | POA: Diagnosis not present

## 2023-01-22 DIAGNOSIS — I110 Hypertensive heart disease with heart failure: Secondary | ICD-10-CM | POA: Diagnosis not present

## 2023-01-22 DIAGNOSIS — E871 Hypo-osmolality and hyponatremia: Secondary | ICD-10-CM | POA: Diagnosis not present

## 2023-01-22 DIAGNOSIS — R4182 Altered mental status, unspecified: Secondary | ICD-10-CM | POA: Diagnosis not present

## 2023-01-22 DIAGNOSIS — Z9981 Dependence on supplemental oxygen: Secondary | ICD-10-CM | POA: Diagnosis not present

## 2023-01-22 DIAGNOSIS — J90 Pleural effusion, not elsewhere classified: Secondary | ICD-10-CM | POA: Diagnosis not present

## 2023-01-22 DIAGNOSIS — J122 Parainfluenza virus pneumonia: Secondary | ICD-10-CM | POA: Diagnosis not present

## 2023-01-22 DIAGNOSIS — R918 Other nonspecific abnormal finding of lung field: Secondary | ICD-10-CM | POA: Diagnosis not present

## 2023-01-22 DIAGNOSIS — F23 Brief psychotic disorder: Secondary | ICD-10-CM | POA: Diagnosis not present

## 2023-01-23 DIAGNOSIS — J122 Parainfluenza virus pneumonia: Secondary | ICD-10-CM | POA: Diagnosis not present

## 2023-01-23 DIAGNOSIS — E871 Hypo-osmolality and hyponatremia: Secondary | ICD-10-CM | POA: Diagnosis not present

## 2023-01-23 DIAGNOSIS — J9601 Acute respiratory failure with hypoxia: Secondary | ICD-10-CM | POA: Diagnosis not present

## 2023-01-24 DIAGNOSIS — J189 Pneumonia, unspecified organism: Secondary | ICD-10-CM | POA: Diagnosis not present

## 2023-01-24 DIAGNOSIS — Z452 Encounter for adjustment and management of vascular access device: Secondary | ICD-10-CM | POA: Diagnosis not present

## 2023-01-24 DIAGNOSIS — E871 Hypo-osmolality and hyponatremia: Secondary | ICD-10-CM | POA: Diagnosis not present

## 2023-01-24 DIAGNOSIS — J90 Pleural effusion, not elsewhere classified: Secondary | ICD-10-CM | POA: Diagnosis not present

## 2023-01-24 DIAGNOSIS — J122 Parainfluenza virus pneumonia: Secondary | ICD-10-CM | POA: Diagnosis not present

## 2023-01-24 DIAGNOSIS — J181 Lobar pneumonia, unspecified organism: Secondary | ICD-10-CM | POA: Diagnosis not present

## 2023-01-24 DIAGNOSIS — R918 Other nonspecific abnormal finding of lung field: Secondary | ICD-10-CM | POA: Diagnosis not present

## 2023-01-24 DIAGNOSIS — J9601 Acute respiratory failure with hypoxia: Secondary | ICD-10-CM | POA: Diagnosis not present

## 2023-01-24 DIAGNOSIS — J811 Chronic pulmonary edema: Secondary | ICD-10-CM | POA: Diagnosis not present

## 2023-01-25 DIAGNOSIS — E871 Hypo-osmolality and hyponatremia: Secondary | ICD-10-CM | POA: Diagnosis not present

## 2023-01-25 DIAGNOSIS — J122 Parainfluenza virus pneumonia: Secondary | ICD-10-CM | POA: Diagnosis not present

## 2023-01-25 DIAGNOSIS — J9601 Acute respiratory failure with hypoxia: Secondary | ICD-10-CM | POA: Diagnosis not present

## 2023-01-26 DIAGNOSIS — J9601 Acute respiratory failure with hypoxia: Secondary | ICD-10-CM | POA: Diagnosis not present

## 2023-01-26 DIAGNOSIS — R0602 Shortness of breath: Secondary | ICD-10-CM | POA: Diagnosis not present

## 2023-01-26 DIAGNOSIS — I700 Atherosclerosis of aorta: Secondary | ICD-10-CM | POA: Diagnosis not present

## 2023-01-26 DIAGNOSIS — J122 Parainfluenza virus pneumonia: Secondary | ICD-10-CM | POA: Diagnosis not present

## 2023-01-26 DIAGNOSIS — E871 Hypo-osmolality and hyponatremia: Secondary | ICD-10-CM | POA: Diagnosis not present

## 2023-01-27 DIAGNOSIS — J204 Acute bronchitis due to parainfluenza virus: Secondary | ICD-10-CM | POA: Diagnosis not present

## 2023-01-27 DIAGNOSIS — R531 Weakness: Secondary | ICD-10-CM | POA: Diagnosis not present

## 2023-01-27 DIAGNOSIS — E871 Hypo-osmolality and hyponatremia: Secondary | ICD-10-CM | POA: Diagnosis not present

## 2023-01-27 DIAGNOSIS — R4182 Altered mental status, unspecified: Secondary | ICD-10-CM | POA: Diagnosis not present

## 2023-01-27 DIAGNOSIS — Z9981 Dependence on supplemental oxygen: Secondary | ICD-10-CM | POA: Diagnosis not present

## 2023-01-27 DIAGNOSIS — J9601 Acute respiratory failure with hypoxia: Secondary | ICD-10-CM | POA: Diagnosis not present

## 2023-01-27 DIAGNOSIS — J122 Parainfluenza virus pneumonia: Secondary | ICD-10-CM | POA: Diagnosis not present

## 2023-01-27 DIAGNOSIS — J90 Pleural effusion, not elsewhere classified: Secondary | ICD-10-CM | POA: Diagnosis not present

## 2023-01-27 DIAGNOSIS — J9811 Atelectasis: Secondary | ICD-10-CM | POA: Diagnosis not present

## 2023-01-28 DIAGNOSIS — J9601 Acute respiratory failure with hypoxia: Secondary | ICD-10-CM | POA: Diagnosis not present

## 2023-01-28 DIAGNOSIS — J9690 Respiratory failure, unspecified, unspecified whether with hypoxia or hypercapnia: Secondary | ICD-10-CM | POA: Diagnosis not present

## 2023-01-28 DIAGNOSIS — J204 Acute bronchitis due to parainfluenza virus: Secondary | ICD-10-CM | POA: Diagnosis not present

## 2023-01-28 DIAGNOSIS — J122 Parainfluenza virus pneumonia: Secondary | ICD-10-CM | POA: Diagnosis not present

## 2023-01-28 DIAGNOSIS — E871 Hypo-osmolality and hyponatremia: Secondary | ICD-10-CM | POA: Diagnosis not present

## 2023-01-28 DIAGNOSIS — J90 Pleural effusion, not elsewhere classified: Secondary | ICD-10-CM | POA: Diagnosis not present

## 2023-01-28 DIAGNOSIS — Z9981 Dependence on supplemental oxygen: Secondary | ICD-10-CM | POA: Diagnosis not present

## 2023-01-28 DIAGNOSIS — J9811 Atelectasis: Secondary | ICD-10-CM | POA: Diagnosis not present

## 2023-01-29 DIAGNOSIS — J122 Parainfluenza virus pneumonia: Secondary | ICD-10-CM | POA: Diagnosis not present

## 2023-01-29 DIAGNOSIS — J9601 Acute respiratory failure with hypoxia: Secondary | ICD-10-CM | POA: Diagnosis not present

## 2023-01-29 DIAGNOSIS — E871 Hypo-osmolality and hyponatremia: Secondary | ICD-10-CM | POA: Diagnosis not present

## 2023-02-01 DIAGNOSIS — R5383 Other fatigue: Secondary | ICD-10-CM | POA: Diagnosis not present

## 2023-02-01 DIAGNOSIS — Z8701 Personal history of pneumonia (recurrent): Secondary | ICD-10-CM | POA: Insufficient documentation

## 2023-02-01 DIAGNOSIS — S81811D Laceration without foreign body, right lower leg, subsequent encounter: Secondary | ICD-10-CM | POA: Diagnosis not present

## 2023-02-01 DIAGNOSIS — R5381 Other malaise: Secondary | ICD-10-CM | POA: Diagnosis not present

## 2023-02-01 DIAGNOSIS — F19951 Other psychoactive substance use, unspecified with psychoactive substance-induced psychotic disorder with hallucinations: Secondary | ICD-10-CM

## 2023-02-01 DIAGNOSIS — K1230 Oral mucositis (ulcerative), unspecified: Secondary | ICD-10-CM | POA: Diagnosis not present

## 2023-02-01 HISTORY — DX: Personal history of pneumonia (recurrent): Z87.01

## 2023-02-01 HISTORY — DX: Other psychoactive substance use, unspecified with psychoactive substance-induced psychotic disorder with hallucinations: F19.951

## 2023-02-08 ENCOUNTER — Ambulatory Visit: Payer: Medicare HMO | Admitting: Cardiology

## 2023-02-15 DIAGNOSIS — G9332 Myalgic encephalomyelitis/chronic fatigue syndrome: Secondary | ICD-10-CM | POA: Diagnosis not present

## 2023-02-15 DIAGNOSIS — I480 Paroxysmal atrial fibrillation: Secondary | ICD-10-CM | POA: Diagnosis not present

## 2023-02-15 DIAGNOSIS — F411 Generalized anxiety disorder: Secondary | ICD-10-CM | POA: Diagnosis not present

## 2023-02-15 DIAGNOSIS — E538 Deficiency of other specified B group vitamins: Secondary | ICD-10-CM | POA: Diagnosis not present

## 2023-02-15 DIAGNOSIS — R112 Nausea with vomiting, unspecified: Secondary | ICD-10-CM | POA: Diagnosis not present

## 2023-02-15 DIAGNOSIS — E039 Hypothyroidism, unspecified: Secondary | ICD-10-CM | POA: Diagnosis not present

## 2023-02-15 DIAGNOSIS — E871 Hypo-osmolality and hyponatremia: Secondary | ICD-10-CM | POA: Diagnosis not present

## 2023-02-24 DIAGNOSIS — I6529 Occlusion and stenosis of unspecified carotid artery: Secondary | ICD-10-CM | POA: Diagnosis not present

## 2023-02-24 DIAGNOSIS — E782 Mixed hyperlipidemia: Secondary | ICD-10-CM | POA: Diagnosis not present

## 2023-03-03 DIAGNOSIS — E871 Hypo-osmolality and hyponatremia: Secondary | ICD-10-CM | POA: Diagnosis not present

## 2023-03-04 DIAGNOSIS — R52 Pain, unspecified: Secondary | ICD-10-CM | POA: Diagnosis not present

## 2023-03-04 DIAGNOSIS — G9332 Myalgic encephalomyelitis/chronic fatigue syndrome: Secondary | ICD-10-CM | POA: Diagnosis not present

## 2023-03-04 DIAGNOSIS — Z79899 Other long term (current) drug therapy: Secondary | ICD-10-CM | POA: Diagnosis not present

## 2023-03-11 DIAGNOSIS — M353 Polymyalgia rheumatica: Secondary | ICD-10-CM | POA: Diagnosis not present

## 2023-03-11 DIAGNOSIS — Z79899 Other long term (current) drug therapy: Secondary | ICD-10-CM | POA: Diagnosis not present

## 2023-03-16 DIAGNOSIS — N3592 Unspecified urethral stricture, female: Secondary | ICD-10-CM | POA: Diagnosis not present

## 2023-03-17 DIAGNOSIS — E871 Hypo-osmolality and hyponatremia: Secondary | ICD-10-CM | POA: Diagnosis not present

## 2023-03-23 DIAGNOSIS — E538 Deficiency of other specified B group vitamins: Secondary | ICD-10-CM | POA: Diagnosis not present

## 2023-04-14 DIAGNOSIS — H6501 Acute serous otitis media, right ear: Secondary | ICD-10-CM | POA: Diagnosis not present

## 2023-04-14 DIAGNOSIS — R110 Nausea: Secondary | ICD-10-CM | POA: Diagnosis not present

## 2023-04-15 DIAGNOSIS — N3592 Unspecified urethral stricture, female: Secondary | ICD-10-CM | POA: Diagnosis not present

## 2023-04-26 DIAGNOSIS — E538 Deficiency of other specified B group vitamins: Secondary | ICD-10-CM | POA: Diagnosis not present

## 2023-05-04 DIAGNOSIS — R5381 Other malaise: Secondary | ICD-10-CM | POA: Diagnosis not present

## 2023-05-04 DIAGNOSIS — I480 Paroxysmal atrial fibrillation: Secondary | ICD-10-CM | POA: Diagnosis not present

## 2023-05-04 DIAGNOSIS — E039 Hypothyroidism, unspecified: Secondary | ICD-10-CM | POA: Diagnosis not present

## 2023-05-04 DIAGNOSIS — E538 Deficiency of other specified B group vitamins: Secondary | ICD-10-CM | POA: Diagnosis not present

## 2023-05-04 DIAGNOSIS — R3915 Urgency of urination: Secondary | ICD-10-CM | POA: Diagnosis not present

## 2023-05-04 DIAGNOSIS — E782 Mixed hyperlipidemia: Secondary | ICD-10-CM | POA: Diagnosis not present

## 2023-05-04 DIAGNOSIS — R5383 Other fatigue: Secondary | ICD-10-CM | POA: Diagnosis not present

## 2023-05-12 DIAGNOSIS — K219 Gastro-esophageal reflux disease without esophagitis: Secondary | ICD-10-CM | POA: Diagnosis not present

## 2023-05-12 DIAGNOSIS — F419 Anxiety disorder, unspecified: Secondary | ICD-10-CM | POA: Diagnosis not present

## 2023-05-12 DIAGNOSIS — E039 Hypothyroidism, unspecified: Secondary | ICD-10-CM | POA: Diagnosis not present

## 2023-05-12 DIAGNOSIS — M150 Primary generalized (osteo)arthritis: Secondary | ICD-10-CM | POA: Diagnosis not present

## 2023-05-12 DIAGNOSIS — I509 Heart failure, unspecified: Secondary | ICD-10-CM | POA: Diagnosis not present

## 2023-05-12 DIAGNOSIS — Z0000 Encounter for general adult medical examination without abnormal findings: Secondary | ICD-10-CM | POA: Diagnosis not present

## 2023-05-12 DIAGNOSIS — I480 Paroxysmal atrial fibrillation: Secondary | ICD-10-CM | POA: Diagnosis not present

## 2023-05-17 DIAGNOSIS — N3592 Unspecified urethral stricture, female: Secondary | ICD-10-CM | POA: Diagnosis not present

## 2023-05-20 DIAGNOSIS — H18593 Other hereditary corneal dystrophies, bilateral: Secondary | ICD-10-CM | POA: Diagnosis not present

## 2023-05-20 DIAGNOSIS — H04123 Dry eye syndrome of bilateral lacrimal glands: Secondary | ICD-10-CM | POA: Diagnosis not present

## 2023-05-20 DIAGNOSIS — H10829 Rosacea conjunctivitis, unspecified eye: Secondary | ICD-10-CM | POA: Diagnosis not present

## 2023-05-20 DIAGNOSIS — H02102 Unspecified ectropion of right lower eyelid: Secondary | ICD-10-CM | POA: Diagnosis not present

## 2023-05-26 DIAGNOSIS — E538 Deficiency of other specified B group vitamins: Secondary | ICD-10-CM | POA: Diagnosis not present

## 2023-06-15 DIAGNOSIS — C44319 Basal cell carcinoma of skin of other parts of face: Secondary | ICD-10-CM | POA: Diagnosis not present

## 2023-06-15 DIAGNOSIS — L820 Inflamed seborrheic keratosis: Secondary | ICD-10-CM | POA: Diagnosis not present

## 2023-06-15 DIAGNOSIS — L578 Other skin changes due to chronic exposure to nonionizing radiation: Secondary | ICD-10-CM | POA: Diagnosis not present

## 2023-06-15 DIAGNOSIS — L570 Actinic keratosis: Secondary | ICD-10-CM | POA: Diagnosis not present

## 2023-06-17 DIAGNOSIS — Z79899 Other long term (current) drug therapy: Secondary | ICD-10-CM | POA: Diagnosis not present

## 2023-06-17 DIAGNOSIS — M353 Polymyalgia rheumatica: Secondary | ICD-10-CM | POA: Diagnosis not present

## 2023-06-24 DIAGNOSIS — N3592 Unspecified urethral stricture, female: Secondary | ICD-10-CM | POA: Diagnosis not present

## 2023-06-28 DIAGNOSIS — E538 Deficiency of other specified B group vitamins: Secondary | ICD-10-CM | POA: Diagnosis not present

## 2023-07-05 DIAGNOSIS — R519 Headache, unspecified: Secondary | ICD-10-CM | POA: Diagnosis not present

## 2023-07-05 DIAGNOSIS — M353 Polymyalgia rheumatica: Secondary | ICD-10-CM | POA: Diagnosis not present

## 2023-07-05 DIAGNOSIS — I480 Paroxysmal atrial fibrillation: Secondary | ICD-10-CM | POA: Diagnosis not present

## 2023-07-05 DIAGNOSIS — E871 Hypo-osmolality and hyponatremia: Secondary | ICD-10-CM | POA: Diagnosis not present

## 2023-07-05 DIAGNOSIS — Z79899 Other long term (current) drug therapy: Secondary | ICD-10-CM | POA: Diagnosis not present

## 2023-07-15 DIAGNOSIS — H9201 Otalgia, right ear: Secondary | ICD-10-CM | POA: Diagnosis not present

## 2023-07-27 DIAGNOSIS — N3592 Unspecified urethral stricture, female: Secondary | ICD-10-CM | POA: Diagnosis not present

## 2023-07-28 DIAGNOSIS — E538 Deficiency of other specified B group vitamins: Secondary | ICD-10-CM | POA: Diagnosis not present

## 2023-07-29 DIAGNOSIS — H18593 Other hereditary corneal dystrophies, bilateral: Secondary | ICD-10-CM | POA: Diagnosis not present

## 2023-07-29 DIAGNOSIS — H10829 Rosacea conjunctivitis, unspecified eye: Secondary | ICD-10-CM | POA: Diagnosis not present

## 2023-07-29 DIAGNOSIS — H524 Presbyopia: Secondary | ICD-10-CM | POA: Diagnosis not present

## 2023-07-29 DIAGNOSIS — H02102 Unspecified ectropion of right lower eyelid: Secondary | ICD-10-CM | POA: Diagnosis not present

## 2023-07-29 DIAGNOSIS — H04123 Dry eye syndrome of bilateral lacrimal glands: Secondary | ICD-10-CM | POA: Diagnosis not present

## 2023-07-29 DIAGNOSIS — E871 Hypo-osmolality and hyponatremia: Secondary | ICD-10-CM | POA: Diagnosis not present

## 2023-08-12 ENCOUNTER — Telehealth: Payer: Self-pay | Admitting: Gastroenterology

## 2023-08-12 NOTE — Telephone Encounter (Signed)
 Good morning Dr. Charlanne  The following patient is requesting continued care with you. She says that her intestines are turning inside out and she has severe pain when having a bowel movement. She has seen Dr. Eileen previously and records are in media on Epic. Please review and advise of scheduling. Thank you.

## 2023-08-17 ENCOUNTER — Emergency Department (HOSPITAL_COMMUNITY)
Admission: EM | Admit: 2023-08-17 | Discharge: 2023-08-17 | Disposition: A | Discharge status: Home / Self Care | Payer: Medicare Other | Attending: Emergency Medicine | Admitting: Emergency Medicine

## 2023-08-17 ENCOUNTER — Encounter (HOSPITAL_COMMUNITY): Payer: Self-pay

## 2023-08-17 ENCOUNTER — Other Ambulatory Visit: Payer: Self-pay

## 2023-08-17 DIAGNOSIS — Z7901 Long term (current) use of anticoagulants: Secondary | ICD-10-CM | POA: Insufficient documentation

## 2023-08-17 DIAGNOSIS — Z79899 Other long term (current) drug therapy: Secondary | ICD-10-CM | POA: Diagnosis not present

## 2023-08-17 DIAGNOSIS — N939 Abnormal uterine and vaginal bleeding, unspecified: Secondary | ICD-10-CM

## 2023-08-17 DIAGNOSIS — I4891 Unspecified atrial fibrillation: Secondary | ICD-10-CM | POA: Diagnosis not present

## 2023-08-17 LAB — COMPREHENSIVE METABOLIC PANEL
ALT: 14 U/L (ref 0–44)
AST: 22 U/L (ref 15–41)
Albumin: 4.2 g/dL (ref 3.5–5.0)
Alkaline Phosphatase: 57 U/L (ref 38–126)
Anion gap: 12 (ref 5–15)
BUN: 10 mg/dL (ref 8–23)
CO2: 19 mmol/L — ABNORMAL LOW (ref 22–32)
Calcium: 9.3 mg/dL (ref 8.9–10.3)
Chloride: 100 mmol/L (ref 98–111)
Creatinine, Ser: 0.79 mg/dL (ref 0.44–1.00)
GFR, Estimated: >60 mL/min (ref >60)
Glucose, Bld: 114 mg/dL — ABNORMAL HIGH (ref 70–99)
Potassium: 3.6 mmol/L (ref 3.5–5.1)
Sodium: 131 mmol/L — ABNORMAL LOW (ref 135–145)
Total Bilirubin: 0.5 mg/dL (ref 0.0–1.2)
Total Protein: 7.4 g/dL (ref 6.5–8.1)

## 2023-08-17 LAB — CBC
HCT: 38.9 % (ref 36.0–46.0)
Hemoglobin: 12.3 g/dL (ref 12.0–15.0)
MCH: 28.1 pg (ref 26.0–34.0)
MCHC: 31.6 g/dL (ref 30.0–36.0)
MCV: 88.8 fL (ref 80.0–100.0)
Platelets: 345 10*3/uL (ref 150–400)
RBC: 4.38 MIL/uL (ref 3.87–5.11)
RDW: 12.9 % (ref 11.5–15.5)
WBC: 11.3 10*3/uL — ABNORMAL HIGH (ref 4.0–10.5)
nRBC: 0 % (ref 0.0–0.2)

## 2023-08-17 LAB — TYPE AND SCREEN
ABO/RH(D): O POS
Antibody Screen: NEGATIVE

## 2023-08-17 NOTE — Telephone Encounter (Signed)
 Okay for APP clinic or mine whichever is earlier [employer]

## 2023-08-17 NOTE — ED Provider Notes (Signed)
 [place] EMERGENCY DEPARTMENT AT [facility] Provider Note   CSN: [id number] Arrival date & time: 08/17/23  1103     History  No chief complaint on file.   [name] is a 83 y.o. female.  HPI    Pt comes in with chief complaint of vaginal bleeding.  Patient states that she woke up this morning and started noticing blood gushing down while she was showering.  She is taking Eliquis because of history of A-fib.  Patient went to urgent care, but was advised to come to the ER.  Review of system is negative for any nausea, vomiting, fevers, chills, dizziness, UTI-like symptoms.  No history of similar symptoms in the past.  Patient denies any bloody stools. Home Medications Prior to Admission medications   Medication Sig Start Date End Date Taking? Authorizing Provider  acetaminophen  (TYLENOL ) 500 MG tablet Take 1,000 mg by mouth every 6 (six) hours as needed for mild pain or moderate pain.    [provider]  ALPRAZolam (XANAX) 0.25 MG tablet Take 0.25 mg by mouth 3 (three) times daily as needed for anxiety. 12/04/14   [provider]  diltiazem  (CARDIZEM  CD) 180 MG 24 hr capsule Take 180 mg by mouth 2 (two) times daily. 10/27/21   [provider]  ELIQUIS 5 MG TABS tablet Take 5 mg by mouth 2 (two) times daily. 02/29/16   [provider]  famotidine (PEPCID) 40 MG tablet Take 40 mg by mouth at bedtime.    [provider]  levothyroxine (SYNTHROID) 75 MCG tablet Take 75 mcg by mouth daily before breakfast.    [provider]  loratadine (CLARITIN) 10 MG tablet Take 10 mg by mouth as needed for allergies.    [provider]  losartan (COZAAR) 50 MG tablet Take 50 mg by mouth daily. 10/27/21   [provider]  ondansetron  (ZOFRAN -ODT) 4 MG disintegrating tablet Take 4 mg by mouth 4 (four) times daily as needed for nausea or vomiting.    [provider]  pantoprazole (PROTONIX) 40 MG tablet Take 40 mg  by mouth daily. 12/03/15   [provider]  simvastatin (ZOCOR) 40 MG tablet Take 40 mg by mouth at bedtime. 08/19/20   [provider]      Allergies    Amiodarone, Protamine, Amoxicillin , Celebrex [celecoxib], Clindamycin, Ace inhibitors, Cleocin [clindamycin hcl], and Diflucan [fluconazole]    Review of Systems   Review of Systems  All other systems reviewed and are negative.   Physical Exam Updated Vital Signs BP (!) 176/65 (BP Location: Right Arm)   Pulse 67   Temp 98.3 F (36.8 C) (Oral)   Resp 18   SpO2 100%  Physical Exam Vitals and nursing note reviewed.  Constitutional:      Appearance: She is well-developed.  HENT:     Head: Atraumatic.  Cardiovascular:     Rate and Rhythm: Normal rate.  Pulmonary:     Effort: Pulmonary effort is normal.  Musculoskeletal:     Cervical back: Normal range of motion and neck supple.  Skin:    General: Skin is warm and dry.  Neurological:     Mental Status: She is alert and oriented to person, place, and time.     ED Results / Procedures / Treatments   Labs (all labs ordered are listed, but only abnormal results are displayed) Labs Reviewed  COMPREHENSIVE METABOLIC PANEL - Abnormal; Notable for the following components:      Result  Value   Sodium 131 (*)    CO2 19 (*)    Glucose, Bld 114 (*)    All other components within normal limits  CBC - Abnormal; Notable for the following components:   WBC 11.3 (*)    All other components within normal limits  TYPE AND SCREEN    EKG None  Radiology No results found.  Procedures Procedures    Medications Ordered in ED Medications - No data to display  ED Course/ Medical Decision Making/ A&P                                 Medical Decision Making Amount and/or Complexity of Data Reviewed Labs: ordered.   Patient with history of A-fib on Eliquis, hysterectomy comes in with chief complaint of bloody vaginal bleeding.  She has no abdominal pain.   She is status post hysterectomy, therefore low concerns for cancer.  Basic labs ordered.  Soon after, patient asked the nursing staff to see if he can discharge the patient.  Patient's daughter is at the bedside now.  The daughter is a engineer, civil (consulting).  She has already consulted gynecology and has an appointment with gynecology this week.  She also evaluated the patient and there is no active bleeding.  Patient was given a tampon for at the urgent care, it is not soiled.  Patient understands that the ER workup has not been comprehensive.  Since she already has a Gyne follow-up, daughter who can take her there -I am comfortable discharging her at this time.  I do not think CT will be helpful.  I advised that she discontinue Eliquis for at least 24 hours until the vaginal bleeding is stopped.  Final Clinical Impression(s) / ED Diagnoses Final diagnoses:  Vaginal bleeding    Rx / DC Orders ED Discharge Orders     None         Charlyn Sora, MD 08/18/23 1351

## 2023-08-17 NOTE — ED Triage Notes (Signed)
 Patient arrived by [facility] for vaginal bleeding that started this am- patient denies pain.  Alert and oriented and on eliquis, sent from primary who confirmed vaginal and not rectal

## 2023-08-17 NOTE — ED Provider Triage Note (Signed)
 Emergency Medicine Provider Triage Evaluation Note  Marva Hendryx , a 83 y.o. female  was evaluated in triage.  Pt complains of of vaginal bleeding, starting today. BRB per vagina. Went to urgent care. Last dose of eliquis this AM.  Review of Systems  Positive: Vaginal bleeding Negative: Pelvic pain, burning with urination  Physical Exam  BP (!) 176/65 (BP Location: Right Arm)   Pulse 67   Temp 98.3 F (36.8 C) (Oral)   Resp 18   SpO2 100%  Gen:   Awake, no distress   Resp:  Normal effort  MSK:   Moves extremities without difficulty    Medical Decision Making  Medically screening exam initiated at 11:45 AM.  Appropriate orders placed.  Talyia Allende was informed that the remainder of the evaluation will be completed by another provider, this initial triage assessment does not replace that evaluation, and the importance of remaining in the ED until their evaluation is complete.  Painless bleed, vaginal, post hysterectomy.  Labs ordered.    Charlyn Sora, MD 08/17/23 1147

## 2023-08-22 LAB — COMPREHENSIVE METABOLIC PANEL
Calcium: 8.9
EGFR: 90

## 2023-08-24 ENCOUNTER — Encounter: Payer: Self-pay | Admitting: *Deleted

## 2023-08-24 ENCOUNTER — Encounter: Payer: Self-pay | Admitting: Cardiology

## 2023-08-24 NOTE — Progress Notes (Signed)
 Cardiology Office Note:    Date:  08/25/2023   ID:  [name], DOB 1941-07-28, MRN [id number]  PCP:  Mina Alter, FNP  Cardiologist:  Zoe Hinds, MD    Referring MD: Mina Alter, FNP    ASSESSMENT:    1. Hypertensive heart disease without heart failure   2. Paroxysmal atrial fibrillation (HCC)   3. Chronic anticoagulation    PLAN:    In order of problems listed above:  Stable she is not on a loop diuretic in retrospect I do not think she had left heart failure in the hospital I think it was all respiratory with either viral or secondary bacterial pneumonia with nonspecific elevation proBNP level For further evaluation recheck her proBNP as well as an x-ray next week Blood pressure controlled and continue treatment including her ARB and calcium channel blocker Continue her statin Continue her current anticoagulant   Next appointment: 3 months   Medication Adjustments/Labs and Tests Ordered: Current medicines are reviewed at length with the patient today.  Concerns regarding medicines are outlined above.  Orders Placed This Encounter  Procedures   DG Chest 2 View   Basic Metabolic Panel (BMET)   Pro b natriuretic peptide (BNP)   EKG 12-Lead   No orders of the defined types were placed in this encounter.    History of Present Illness:    [name] is a 83 y.o. female with a hx of paroxysmal atrial fibrillation with multiple EP catheter ablation with chronic anticoagulation hypertensive heart and fatigue disease last seen 01/19/2022.  Recently [facility] admitted 012 and discharged 2 days later and although the discharge diagnoses include elevated BNP elevated troponin and pulmonary edema chart review shows she was admitted with influenza chest x-ray my opinion was more consistent with pneumonia than heart failure she had atrial fibrillation in the hospital I am unsure if it was persistent or paroxysmal from the discharge summary she received 1 dose  of an IV diuretic received antibiotics and Tamiflu and despite these cardiac discharge diagnoses was not seen by our practice and at discharge was not advised to be seen by us  was referred to follow-up with her PCP.  proBNP level was low greater than 2000 she was hyponatremic with a sodium of 127 and a troponin that I see in the chart was minimal gray zone 0.05.  Chest x-ray showed middle and lower lobe predominant interstitial thickening and left lower lung zone subsegmental atelectasis.  Compliance with diet, lifestyle and medications: Yes  This is a complex visit. I reviewed extensive information from her hospitalization in summary I do not think she had congestive heart failure I think her problem was either viral pneumonia or secondary bacterial pneumonia and I think she responded to inpatient treatment She still finds herself somewhat short of breath at times not severe or sustained and she said that she both coughs and wheezes. She tells me her atrial fibrillation was brief in the hospital and she attributed to missing her cardiac medications when she first was admitted He still feels weak but is not having edema orthopnea chest pain palpitation or syncope For further evaluation I asked her to allow us  to recheck her proBNP level and also a repeat chest x-ray next week she is still finishing Tamiflu Past Medical History:  Diagnosis Date    Possible Neuropathy 04/16/2015   Adverse effect of drug 04/16/2015   Anesthesia to pain 03/02/2017   Overview:  reaction to Protamine with 2nd cardiac ablation.  Anticoagulant long-term use    ELIQUIS   Anticoagulated 06/20/2016   Anxiety 12/05/2015   Atrial fibrillation (HCC) 01/10/2014   Atrial flutter by electrocardiogram (HCC) 06/06/2014   Atrial flutter, paroxysmal (HCC)    Atypical atrial flutter (HCC) 10/21/2016   Bradycardia 02/28/2021   Carotid atherosclerosis 04/21/2018   Chronic anticoagulation 02/13/2015   Congenital anomaly of  pulmonary vein    per Cardiac MRI done at [facility] 02-16-2014-- noted 5 pulmonary veins (3 on the right and 2 on the left ) to left atrium   Congestive heart failure (HCC) 01/10/2014   Current use of long term anticoagulation 02/13/2015   Drug reaction to Amoxicillin 04/16/2015   Eczema 04/16/2015   Encounter for monitoring sotalol therapy 06/06/2014   Encounter for therapeutic drug level monitoring 06/06/2014   Esophageal reflux 01/10/2014   First degree heart block    Gastroesophageal reflux disease 01/10/2014   GERD (gastroesophageal reflux disease)    High risk medication use 02/13/2015   Overview:  Ticosyn   History of amiodarone therapy 06/20/2016   History of diverticulitis 06/25/2016   History of diverticulitis of colon    04/ 2017   Hyperlipidemia    Hypertension    Hyponatremia 05/17/2016   Hypothyroid 12/05/2015   Hypothyroidism    Hypothyroidism (acquired) 01/10/2014   Long term current use of antiarrhythmic drug 10/15/2016   Mild CAD 01/17/2019   On amiodarone therapy 03/23/2016   Ovarian cyst 12/05/2015   PAF (paroxysmal atrial fibrillation) [facility]) cardiologist- dr Sandee Crook [place])   Paroxysmal atrial fibrillation (HCC) 02/13/2015   Overview:  had pulmonary vein ablation for AF in 2010 and redo for recurrence in April 2012  * CHADS2 score=2  Overview:  had pulmonary vein ablation for AF in 2010 and redo for recurrence in April 2012  * CHADS2 score=2   PAT (paroxysmal atrial tachycardia) (HCC)    Pre-operative cardiovascular examination 03/03/2017   Rectocele 12/05/2015   S/P ablation of atrial fibrillation 2011  &  2012  &  02-19-2014 at [facility]   EP cardiologist-- dr Jonnie Nettles ([place])   Systolic CHF [facility])    Urethral stricture    Ventral hernia 05/17/2016   Wears glasses     Current Medications: Current Meds  Medication Sig   acetaminophen  (TYLENOL ) 500 MG tablet Take 1,000 mg by mouth every 6 (six) hours as needed for mild pain or moderate pain.   albuterol  (VENTOLIN HFA) 108 (90 Base) MCG/ACT inhaler Inhale 2 puffs into the lungs every 6 (six) hours as needed for wheezing or shortness of breath.   ALPRAZolam (XANAX) 0.25 MG tablet Take 0.25 mg by mouth 3 (three) times daily as needed for anxiety.   azithromycin  (ZITHROMAX ) 250 MG tablet Take 250 mg by mouth daily.   Cholecalciferol 125 MCG (5000 UT) TABS Take 5,000 Units by mouth daily.   cyanocobalamin  (VITAMIN B12) 1000 MCG/ML injection Inject 1,000 mcg into the muscle every 30 (thirty) days.   diltiazem  (CARDIZEM  CD) 180 MG 24 hr capsule Take 180 mg by mouth 2 (two) times daily.   ELIQUIS 5 MG TABS tablet Take 5 mg by mouth 2 (two) times daily.   levothyroxine (SYNTHROID) 75 MCG tablet Take 75 mcg by mouth daily before breakfast.   loratadine (CLARITIN) 10 MG tablet Take 10 mg by mouth as needed for allergies.   losartan (COZAAR) 50 MG tablet Take 50 mg by mouth daily.   Multiple Vitamin (MULTIVITAMIN) capsule Take 1 capsule by mouth daily.   omeprazole (PRILOSEC)  40 MG capsule Take 40 mg by mouth daily.   ondansetron  (ZOFRAN -ODT) 4 MG disintegrating tablet Take 4 mg by mouth 4 (four) times daily as needed for nausea or vomiting.   pantoprazole (PROTONIX) 40 MG tablet Take 40 mg by mouth daily.   predniSONE (DELTASONE) 5 MG tablet Take 5 mg by mouth daily with breakfast.   simvastatin (ZOCOR) 40 MG tablet Take 40 mg by mouth at bedtime.      EKGs/Labs/Other Studies Reviewed:    The following studies were reviewed today:  Cardiac Studies & Procedures     STRESS TESTS  MYOCARDIAL PERFUSION IMAGING 03/16/2017  Narrative  Nuclear stress EF: 75%.  No T wave inversion was noted during stress.  There was no ST segment deviation noted during stress.  Defect 1: There is a small defect of moderate severity.  This is a low risk study.  Small size, moderate intensity fixed anteroapical attenuation artifact. No significant reversible ischemia. LVEF 75% with normal wall motion. This is a  low risk study.  ECHOCARDIOGRAM  ECHOCARDIOGRAM COMPLETE 06/30/2021  Narrative ECHOCARDIOGRAM REPORT    Patient Name:   Denyce Sliger Date of Exam: 06/30/2021 Medical Rec #:  [id number]     Height:       61.6 in Accession #:    [id number]    Weight:       138.4 lb Date of Birth:  11/17/1940     BSA:          1.627 m Patient Age:    80 years      BP:           162/82 mmHg Patient Gender: F             HR:           64 bpm. Exam Location:  [facility]  Procedure: 2D Echo, Cardiac Doppler, Color Doppler and Strain Analysis  Indications:    Nonspecific abnormal electrocardiogram (ECG) (EKG) [R94.31 (ICD-10-CM)]  History:        Patient has no prior history of Echocardiogram examinations. CHF, CAD, S/P ablation of atrial fibrillation, Arrythmias:Atrial Fibrillation, First degree heart block and Bradycardia, Signs/Symptoms:Hypertensive heart disease without heart failure; Risk Factors:Dyslipidemia.  Sonographer:    Kristen Petri RDCS Referring Phys: [id number] [place] Duan J Fredrica Capano  IMPRESSIONS   1. Left ventricular ejection fraction, by estimation, is 60 to 65%. The left ventricle has normal function. The left ventricle has no regional wall motion abnormalities. There is mild concentric left ventricular hypertrophy. Left ventricular diastolic parameters are indeterminate. The average left ventricular global longitudinal strain is -15.4 %. 2. Right ventricular systolic function is normal. The right ventricular size is normal. There is moderately elevated pulmonary artery systolic pressure. 3. Left atrial size was mildly dilated. 4. The mitral valve is degenerative. Mild mitral valve regurgitation. No evidence of mitral stenosis. 5. Tricuspid valve regurgitation is moderate. 6. The aortic valve is tricuspid. Aortic valve regurgitation is not visualized. No aortic stenosis is present. 7. The inferior vena cava is normal in size with greater than 50% respiratory variability, suggesting right atrial  pressure of 3 mmHg.  FINDINGS Left Ventricle: Left ventricular ejection fraction, by estimation, is 60 to 65%. The left ventricle has normal function. The left ventricle has no regional wall motion abnormalities. The average left ventricular global longitudinal strain is -15.4 %. The left ventricular internal cavity size was normal in size. There is mild concentric left ventricular hypertrophy. Left ventricular diastolic parameters are indeterminate.  Right Ventricle: The right  ventricular size is normal. No increase in right ventricular wall thickness. Right ventricular systolic function is normal. There is moderately elevated pulmonary artery systolic pressure. The tricuspid regurgitant velocity is 3.32 m/s, and with an assumed right atrial pressure of 3 mmHg, the estimated right ventricular systolic pressure is 47.1 mmHg.  Left Atrium: Left atrial size was mildly dilated.  Right Atrium: Right atrial size was normal in size.  Pericardium: There is no evidence of pericardial effusion.  Mitral Valve: The mitral valve is degenerative in appearance. Mild mitral annular calcification. Mild mitral valve regurgitation. No evidence of mitral valve stenosis.  Tricuspid Valve: The tricuspid valve is normal in structure. Tricuspid valve regurgitation is moderate . No evidence of tricuspid stenosis.  Aortic Valve: The aortic valve is tricuspid. Aortic valve regurgitation is not visualized. No aortic stenosis is present.  Pulmonic Valve: The pulmonic valve was normal in structure. Pulmonic valve regurgitation is not visualized. No evidence of pulmonic stenosis.  Aorta: The aortic root and ascending aorta are structurally normal, with no evidence of dilitation and the aortic arch and DTA were not well visualized.  Venous: The pulmonary veins were not well visualized. The inferior vena cava is normal in size with greater than 50% respiratory variability, suggesting right atrial pressure of 3  mmHg.  IAS/Shunts: No atrial level shunt detected by color flow Doppler.   LEFT VENTRICLE PLAX 2D LVIDd:         4.40 cm   Diastology LVIDs:         2.50 cm   LV e' medial:    7.40 cm/s LV PW:         1.10 cm   LV E/e' medial:  11.8 LV IVS:        1.20 cm   LV e' lateral:   7.40 cm/s LVOT diam:     2.00 cm   LV E/e' lateral: 11.8 LV SV:         82 LV SV Index:   50        2D Longitudinal Strain LVOT Area:     3.14 cm  2D Strain GLS Avg:     -15.4 %   RIGHT VENTRICLE         IVC TAPSE (M-mode): 2.8 cm  IVC diam: 1.70 cm  LEFT ATRIUM             Index        RIGHT ATRIUM           Index LA diam:        3.50 cm 2.15 cm/m   RA Area:     16.70 cm LA Vol (A2C):   57.8 ml 35.52 ml/m  RA Volume:   40.65 ml  24.98 ml/m LA Vol (A4C):   61.0 ml 37.49 ml/m LA Biplane Vol: 60.1 ml 36.93 ml/m AORTIC VALVE LVOT Vmax:   120.00 cm/s LVOT Vmean:  79.900 cm/s LVOT VTI:    0.261 m  AORTA Ao Root diam: 3.00 cm Ao Asc diam:  3.30 cm Ao Desc diam: 2.00 cm  MITRAL VALVE               TRICUSPID VALVE MV Area (PHT): 3.35 cm    TR Peak grad:   44.1 mmHg MV Decel Time: 227 msec    TR Vmax:        332.00 cm/s MV E velocity: 87.50 cm/s MV A velocity: 23.00 cm/s  SHUNTS MV E/A ratio:  3.80  Systemic VTI:  0.26 m Systemic Diam: 2.00 cm  Zoe Hinds MD Electronically signed by Zoe Hinds MD Signature Date/Time: 06/30/2021/4:12:26 PM    Final   MONITORS  LONG TERM MONITOR (3-14 DAYS) 03/07/2021  Narrative Patch Wear Time:  3 days and 1 hours (2022-07-22T16:06:16-0400 to 2022-07-25T17:15:18-0400)  Patient had a min HR of 45 bpm, max HR of 136 bpm, and avg HR of 59 bpm. Predominant underlying rhythm was Sinus Rhythm. 12 Supraventricular Tachycardia runs occurred, the run with the fastest interval lasting 4 beats with a max rate of 136 bpm, the longest lasting 7 beats with an avg rate of 113 bpm. Isolated SVEs were occasional (3.7%, 9312), SVE Couplets were rare (<1.0%, 88), and  SVE Triplets were rare (<1.0%, 25). Isolated VEs were rare (<1.0%), VE Couplets were rare (<1.0%), and no VE Triplets were present. Ventricular Trigeminy was present.  Supraventricular ectopy was occasional burden 3.7% and no episodes of atrial fibrillation or flutter.  There were 12 brief runs of APCs 1 of which appears to be atrial tachycardia with variable conduction the longest 7 complexes rate of 113 bpm.  Ventricular ectopy was rare.  There were no pauses of 3 seconds or greater and no episodes of second or third-degree AV nodal block or sinus node exit block.  There were no triggered or symptomatic events.           EKG Interpretation Date/Time:  Wednesday August 25 2023 10:30:49 EST Ventricular Rate:  63 PR Interval:  176 QRS Duration:  84 QT Interval:  376 QTC Calculation: 384 R Axis:   41  Text Interpretation: Normal sinus rhythm ST & T wave abnormality, consider lateral ischemia When compared with ECG of 16-Jul-2005 07:16, Improved ST segment Confirmed by Zoe Hinds ([id number]) on 08/25/2023 10:34:09 AM   Recent Labs: 08/17/2023: ALT 14; BUN 10; Creatinine, Ser 0.79; Hemoglobin 12.3; Platelets 345; Potassium 3.6; Sodium 131  Recent Lipid Panel No results found for: "CHOL", "TRIG", "HDL", "CHOLHDL", "VLDL", "LDLCALC", "LDLDIRECT"  Physical Exam:    VS:  BP (!) 160/70 (BP Location: Right Arm, Patient Position: Sitting, Cuff Size: Normal)   Pulse 63   Ht 5\' 1"  (1.549 m)   Wt 139 lb (63 kg)   SpO2 98%   BMI 26.26 kg/m     Wt Readings from Last 3 Encounters:  08/25/23 139 lb (63 kg)  01/19/22 137 lb 9.6 oz (62.4 kg)  06/20/21 138 lb 6.4 oz (62.8 kg)     GEN: Anxious well nourished, well developed in no acute distress HEENT: Normal NECK: No JVD; No carotid bruits LYMPHATICS: No lymphadenopathy CARDIAC: RRR, no murmurs, rubs, gallops RESPIRATORY:  Clear to auscultation without rales, wheezing or rhonchi  ABDOMEN: Soft, non-tender, non-distended MUSCULOSKELETAL:  No  edema; No deformity  SKIN: Warm and dry NEUROLOGIC:  Alert and oriented x 3 PSYCHIATRIC:  Normal affect    Signed, Zoe Hinds, MD  08/25/2023 11:07 AM    [facility]

## 2023-08-25 ENCOUNTER — Ambulatory Visit: Payer: Medicare Other | Attending: Cardiology | Admitting: Cardiology

## 2023-08-25 ENCOUNTER — Encounter: Payer: Self-pay | Admitting: Cardiology

## 2023-08-25 VITALS — BP 160/70 | HR 63 | Ht 61.0 in | Wt 139.0 lb

## 2023-08-25 DIAGNOSIS — I480 Paroxysmal atrial fibrillation: Secondary | ICD-10-CM | POA: Diagnosis not present

## 2023-08-25 DIAGNOSIS — I119 Hypertensive heart disease without heart failure: Secondary | ICD-10-CM | POA: Diagnosis not present

## 2023-08-25 DIAGNOSIS — Z7901 Long term (current) use of anticoagulants: Secondary | ICD-10-CM

## 2023-08-25 NOTE — Patient Instructions (Signed)
 Medication Instructions:  Your physician recommends that you continue on your current medications as directed. Please refer to the Current Medication list given to you today.  *If you need a refill on your cardiac medications before your next appointment, please call your pharmacy*   Lab Work: Your physician recommends that you return for lab work in:   Labs today: BMP, Pro BNP  If you have labs (blood work) drawn today and your tests are completely normal, you will receive your results only by: MyChart Message (if you have MyChart) OR A paper copy in the mail If you have any lab test that is abnormal or we need to change your treatment, we will call you to review the results.   Testing/Procedures: A chest x-ray takes a picture of the organs and structures inside the chest, including the heart, lungs, and blood vessels. This test can show several things, including, whether the heart is enlarges; whether fluid is building up in the lungs; and whether pacemaker / defibrillator leads are still in place.    Follow-Up: At [facility], you and your health needs are our priority.  As part of our continuing mission to provide you with exceptional heart care, we have created designated Provider Care Teams.  These Care Teams include your primary Cardiologist (physician) and Advanced Practice Providers (APPs -  Physician Assistants and Nurse Practitioners) who all work together to provide you with the care you need, when you need it.  We recommend signing up for the patient portal called "MyChart".  Sign up information is provided on this After Visit Summary.  MyChart is used to connect with patients for Virtual Visits (Telemedicine).  Patients are able to view lab/test results, encounter notes, upcoming appointments, etc.  Non-urgent messages can be sent to your provider as well.   To learn more about what you can do with MyChart, go to [email or web address].    Your next appointment:    3 month(s)  Provider:   Zoe Hinds, MD    Other Instructions None

## 2023-08-27 LAB — BASIC METABOLIC PANEL
BUN/Creatinine Ratio: 14 (ref 12–28)
BUN: 11 mg/dL (ref 8–27)
CO2: 19 mmol/L — ABNORMAL LOW (ref 20–29)
Calcium: 9.6 mg/dL (ref 8.7–10.3)
Chloride: 98 mmol/L (ref 96–106)
Creatinine, Ser: 0.76 mg/dL (ref 0.57–1.00)
Glucose: 90 mg/dL (ref 70–99)
Potassium: 4.5 mmol/L (ref 3.5–5.2)
Sodium: 137 mmol/L (ref 134–144)
eGFR: 78 mL/min/{1.73_m2} (ref >59)

## 2023-08-27 LAB — PRO B NATRIURETIC PEPTIDE: NT-Pro BNP: 261 pg/mL (ref 0–738)

## 2023-09-02 ENCOUNTER — Ambulatory Visit (HOSPITAL_BASED_OUTPATIENT_CLINIC_OR_DEPARTMENT_OTHER)
Admission: RE | Admit: 2023-09-02 | Discharge: 2023-09-02 | Disposition: A | Discharge status: Home / Self Care | Payer: Medicare Other | Source: Ambulatory Visit | Attending: Cardiology | Admitting: Cardiology

## 2023-09-02 DIAGNOSIS — Z7901 Long term (current) use of anticoagulants: Secondary | ICD-10-CM

## 2023-09-02 DIAGNOSIS — R0602 Shortness of breath: Secondary | ICD-10-CM | POA: Diagnosis not present

## 2023-09-02 DIAGNOSIS — I480 Paroxysmal atrial fibrillation: Secondary | ICD-10-CM

## 2023-09-02 DIAGNOSIS — I119 Hypertensive heart disease without heart failure: Secondary | ICD-10-CM

## 2023-09-21 NOTE — Telephone Encounter (Signed)
Left a voicemail for PT to call and schedule

## 2024-01-10 DIAGNOSIS — K648 Other hemorrhoids: Secondary | ICD-10-CM | POA: Insufficient documentation

## 2024-01-10 DIAGNOSIS — K5732 Diverticulitis of large intestine without perforation or abscess without bleeding: Secondary | ICD-10-CM

## 2024-01-10 DIAGNOSIS — R339 Retention of urine, unspecified: Secondary | ICD-10-CM | POA: Insufficient documentation

## 2024-01-10 DIAGNOSIS — J811 Chronic pulmonary edema: Secondary | ICD-10-CM | POA: Insufficient documentation

## 2024-01-10 DIAGNOSIS — N8110 Cystocele, unspecified: Secondary | ICD-10-CM

## 2024-01-10 DIAGNOSIS — R270 Ataxia, unspecified: Secondary | ICD-10-CM

## 2024-01-10 DIAGNOSIS — R7989 Other specified abnormal findings of blood chemistry: Secondary | ICD-10-CM | POA: Insufficient documentation

## 2024-01-10 DIAGNOSIS — D638 Anemia in other chronic diseases classified elsewhere: Secondary | ICD-10-CM | POA: Insufficient documentation

## 2024-01-10 DIAGNOSIS — J90 Pleural effusion, not elsewhere classified: Secondary | ICD-10-CM | POA: Insufficient documentation

## 2024-01-10 DIAGNOSIS — R109 Unspecified abdominal pain: Secondary | ICD-10-CM | POA: Insufficient documentation

## 2024-01-10 DIAGNOSIS — J9811 Atelectasis: Secondary | ICD-10-CM | POA: Insufficient documentation

## 2024-01-10 DIAGNOSIS — J204 Acute bronchitis due to parainfluenza virus: Secondary | ICD-10-CM | POA: Insufficient documentation

## 2024-01-10 DIAGNOSIS — R112 Nausea with vomiting, unspecified: Secondary | ICD-10-CM | POA: Insufficient documentation

## 2024-01-10 DIAGNOSIS — J209 Acute bronchitis, unspecified: Secondary | ICD-10-CM | POA: Insufficient documentation

## 2024-01-10 DIAGNOSIS — K5792 Diverticulitis of intestine, part unspecified, without perforation or abscess without bleeding: Secondary | ICD-10-CM

## 2024-01-10 DIAGNOSIS — I160 Hypertensive urgency: Secondary | ICD-10-CM

## 2024-01-10 DIAGNOSIS — J189 Pneumonia, unspecified organism: Secondary | ICD-10-CM

## 2024-01-10 HISTORY — DX: Unspecified abdominal pain: R10.9

## 2024-01-10 HISTORY — DX: Atelectasis: J98.11

## 2024-01-10 HISTORY — DX: Diverticulitis of intestine, part unspecified, without perforation or abscess without bleeding: K57.92

## 2024-01-10 HISTORY — DX: Acute bronchitis due to parainfluenza virus: J20.4

## 2024-01-10 HISTORY — DX: Diverticulitis of large intestine without perforation or abscess without bleeding: K57.32

## 2024-01-10 HISTORY — DX: Hypertensive urgency: I16.0

## 2024-01-10 HISTORY — DX: Cystocele, unspecified: N81.10

## 2024-01-10 HISTORY — DX: Chronic pulmonary edema: J81.1

## 2024-01-10 HISTORY — DX: Other hemorrhoids: K64.8

## 2024-01-10 HISTORY — DX: Pneumonia, unspecified organism: J18.9

## 2024-01-10 HISTORY — DX: Other specified abnormal findings of blood chemistry: R79.89

## 2024-01-10 HISTORY — DX: Ataxia, unspecified: R27.0

## 2024-01-10 HISTORY — DX: Anemia in other chronic diseases classified elsewhere: D63.8

## 2024-01-10 HISTORY — DX: Acute bronchitis, unspecified: J20.9

## 2024-01-10 HISTORY — DX: Pleural effusion, not elsewhere classified: J90

## 2024-01-10 HISTORY — DX: Retention of urine, unspecified: R33.9

## 2024-01-10 HISTORY — DX: Nausea with vomiting, unspecified: R11.2

## 2024-02-07 ENCOUNTER — Encounter: Payer: Self-pay | Admitting: Allergy and Immunology

## 2024-02-07 ENCOUNTER — Ambulatory Visit: Payer: Self-pay | Admitting: Allergy and Immunology

## 2024-02-07 VITALS — BP 142/62 | HR 67 | Temp 98.2°F | Resp 20 | Ht 59.0 in | Wt 141.0 lb

## 2024-02-07 DIAGNOSIS — T50905A Adverse effect of unspecified drugs, medicaments and biological substances, initial encounter: Secondary | ICD-10-CM

## 2024-02-07 DIAGNOSIS — Z880 Allergy status to penicillin: Secondary | ICD-10-CM | POA: Diagnosis not present

## 2024-02-07 NOTE — Progress Notes (Unsigned)
 [place] - [place] - [place]   Dear [name],  Thank you for referring [name] to the [facility] [place]  on 02/07/2024.   Below is a summation of this patient's evaluation and recommendations.  Thank you for your referral. I will keep you informed about this patient's response to treatment.   If you have any questions please do not hesitate to contact me.   Sincerely,  Sydney DOROTHA Denis, MD Allergy / Immunology [place] [place]    ______________________________________________________________________    NEW PATIENT NOTE  Referring Provider: Sprague Sydney BROCKS, FNP Primary Provider: Sprague Sydney BROCKS, FNP Date of office visit: 02/07/2024    Subjective:   Chief Complaint:  [name] (DOB: May 07, 1941) is a 83 y.o. female who presents to the clinic on 02/07/2024 with a chief complaint of Allergic Reaction (In her 40's went to Dentist given 4500 mg penicillin caused a rash ) .     HPI: Ardath presents to this clinic in evaluation of drug allergy.  She carries the diagnosis of penicillin allergy when she was a young child with the details of that reaction unknown.  She was taking amoxicillin for mitral valve prolapse since her 83s and in 2001 she took amoxicillin for some type of respiratory tract infection and she developed a rash on her face on day 6 of use.  She took some antihistamines and may have received some steroids and her rash lasted about 48 hours or so without any other associated systemic or constitutional symptoms.  Then in 2010 she was given Augmentin and she thinks she did okay but she cannot really remember exactly what she took and exactly if she had a reaction or not.  She also has questions about a rash associated with Celebrex use a decade or 2 ago.  She took Celebrex and several days later developed a rash for which she took some antihistamines and her  rash was gone a few days later.  She used Celebrex for joint pain.  Currently she is receiving a systemic steroid injection every 4 months to deal with her arthralgia.  And she carries the diagnosis of polymyalgia rheumatica treated with low-dose oral steroids.  She has also developed a rash with clindamycin use and fluconazole use once again without any associated systemic or constitutional symptoms.  Apparently she gets pretty significant mania when receiving oral steroids.  However, as noted above, she is receiving systemic steroid injection every 4 months for her arthralgia.  Past Medical History:  Diagnosis Date    Possible Neuropathy 04/16/2015   Adverse effect of drug 04/16/2015   Anesthesia to pain 03/02/2017   Overview:  reaction to Protamine with 2nd cardiac ablation.   Anticoagulant long-term use    ELIQUIS   Anticoagulated 06/20/2016   Anxiety 12/05/2015   Atrial fibrillation (HCC) 01/10/2014   Atrial flutter by electrocardiogram (HCC) 06/06/2014   Atrial flutter, paroxysmal (HCC)    Atypical atrial flutter (HCC) 10/21/2016   Bradycardia 02/28/2021   Carotid atherosclerosis 04/21/2018   Chronic anticoagulation 02/13/2015   Congenital anomaly of pulmonary vein    per Cardiac MRI done at [facility] 02-16-2014-- noted 5 pulmonary veins (3 on the right and 2 on the left ) to left atrium   Congestive heart failure (HCC) 01/10/2014   Current use of long term anticoagulation 02/13/2015   Drug reaction to Amoxicillin 04/16/2015   Eczema 04/16/2015   Encounter for monitoring  sotalol therapy 06/06/2014   Encounter for therapeutic drug level monitoring 06/06/2014   Esophageal reflux 01/10/2014   First degree heart block    Gastroesophageal reflux disease 01/10/2014   GERD (gastroesophageal reflux disease)    High risk medication use 02/13/2015   Overview:  Ticosyn   History of amiodarone therapy 06/20/2016   History of diverticulitis 06/25/2016   History of diverticulitis of colon     04/ 2017   Hyperlipidemia    Hypertension    Hyponatremia 05/17/2016   Hypothyroid 12/05/2015   Hypothyroidism    Hypothyroidism (acquired) 01/10/2014   Long term current use of antiarrhythmic drug 10/15/2016   Mild CAD 01/17/2019   On amiodarone therapy 03/23/2016   Ovarian cyst 12/05/2015   PAF (paroxysmal atrial fibrillation) [facility]) cardiologist- dr monetta jennye)   Paroxysmal atrial fibrillation (HCC) 02/13/2015   Overview:  had pulmonary vein ablation for AF in 2010 and redo for recurrence in April 2012  * CHADS2 score=2  Overview:  had pulmonary vein ablation for AF in 2010 and redo for recurrence in April 2012  * CHADS2 score=2   PAT (paroxysmal atrial tachycardia) (HCC)    Pre-operative cardiovascular examination 03/03/2017   Rectocele 12/05/2015   S/P ablation of atrial fibrillation 2011  &  2012  &  02-19-2014 at [facility]   EP cardiologist-- dr franky mace ([facility])   Systolic CHF [facility])    Urethral stricture    Ventral hernia 05/17/2016   Wears glasses     Past Surgical History:  Procedure Laterality Date   ABDOMINAL HERNIA REPAIR  10/ 2017   at [facility]/  and 1989   ABDOMINAL HYSTERECTOMY  06/1985   ANTERIOR REPAIR AND TRANSOBTURATOR SLING  09/08/2010   CYSTOCELE AND SUI   CARDIAC CATHETERIZATION  1991 ([facility]) &  2006 (dr morris)   lvf preserved/ 45-50% mid rca/  40% lad after takeoff diagonal branch   CARDIAC ELECTROPHYSIOLOGY STUDY AND ABLATION  x3 2011  &  2012  ([facility]) and 02-19-2014 at [facility]   per EP study report 02-19-2014-- re-do PVI (LIPV & RSPV re-isolated) left atrial roof line & CTI line performed   CARDIOVASCULAR STRESS TEST  04/08/2009   normal study/ ef 65%   CARDIOVERSION  last one 09-01-2016  in [place]   CATARACT EXTRACTION Bilateral    CYSTOSCOPY WITH URETHRAL DILATATION N/A 12/23/2012   Procedure: CYSTOSCOPY WITH BALLOON DILATATION;  Surgeon: Mark C Ottelin, MD;  Location: [facility];  Service: Urology;   Laterality: N/A;   RECTOCELE REPAIR  03/23/2017   TRANSESOPHAGEAL ECHOCARDIOGRAM  05/20/2009   normal lvf and structure/ ef 60-65%/ no thrombus   TUBAL LIGATION  1978    Allergies as of 02/07/2024       Reactions   Amiodarone Nausea Only   Protamine Anaphylaxis   Amoxicillin Rash   Celebrex [celecoxib] Rash   Clindamycin Rash   Ace Inhibitors Cough   Dexamethasone     Other Reaction(s): Psychosis Solumedrol in large doses at recent hospital visit results in psychosis. Other Reaction(s): Psychosis    Solumedrol in large doses at recent hospital visit results in psychosis.   Cleocin [clindamycin Hcl] Rash   Diflucan [fluconazole] Rash   Recently took without complication 02/05/16        Medication List    acetaminophen  500 MG tablet Commonly known as: TYLENOL  Take 1,000 mg by mouth every 6 (six) hours as needed for mild pain or moderate pain.   albuterol 108 (90 Base)  MCG/ACT inhaler Commonly known as: VENTOLIN HFA Inhale 2 puffs into the lungs every 6 (six) hours as needed for wheezing or shortness of breath.   ALPRAZolam 0.25 MG tablet Commonly known as: XANAX Take 0.25 mg by mouth 3 (three) times daily as needed for anxiety.   azithromycin  250 MG tablet Commonly known as: ZITHROMAX  Take 250 mg by mouth daily.   Cholecalciferol 125 MCG (5000 UT) Tabs Take 5,000 Units by mouth daily.   cyanocobalamin  1000 MCG/ML injection Commonly known as: VITAMIN B12 Inject 1,000 mcg into the muscle every 30 (thirty) days.   diltiazem  180 MG 24 hr capsule Commonly known as: CARDIZEM  CD Take 180 mg by mouth 2 (two) times daily.   Eliquis 5 MG Tabs tablet Generic drug: apixaban Take 5 mg by mouth 2 (two) times daily.   levothyroxine 75 MCG tablet Commonly known as: SYNTHROID Take 75 mcg by mouth daily before breakfast.   loratadine 10 MG tablet Commonly known as: CLARITIN Take 10 mg by mouth as needed for allergies.   losartan 50 MG tablet Commonly known as:  COZAAR Take 50 mg by mouth daily.   montelukast 10 MG tablet Commonly known as: SINGULAIR Take 10 mg by mouth at bedtime.   multivitamin capsule Take 1 capsule by mouth daily.   omeprazole 40 MG capsule Commonly known as: PRILOSEC Take 40 mg by mouth daily.   ondansetron  4 MG disintegrating tablet Commonly known as: ZOFRAN -ODT Take 4 mg by mouth 4 (four) times daily as needed for nausea or vomiting.   pantoprazole 40 MG tablet Commonly known as: PROTONIX Take 40 mg by mouth daily.   predniSONE 5 MG tablet Commonly known as: DELTASONE Take 5 mg by mouth daily with breakfast.   simvastatin 40 MG tablet Commonly known as: ZOCOR Take 40 mg by mouth at bedtime.    Review of systems negative except as noted in HPI / PMHx or noted below:  Review of Systems  Constitutional: Negative.   HENT: Negative.    Eyes: Negative.   Respiratory: Negative.    Cardiovascular: Negative.   Gastrointestinal: Negative.   Genitourinary: Negative.   Musculoskeletal: Negative.   Skin: Negative.   Neurological: Negative.   Endo/Heme/Allergies: Negative.   Psychiatric/Behavioral: Negative.      Family History  Problem Relation Age of Onset   Stroke Mother    Hypertension Mother    Congestive Heart Failure Mother    Heart attack Father    Heart disease Father    Heart attack Sister    Liver cancer Maternal Grandmother    Liver cancer Maternal Grandfather    Diabetes Maternal Grandfather    Stroke Paternal Grandmother    Diabetes Son     Social History   Socioeconomic History   Marital status: Widowed    Spouse name: Not on file   Number of children: 4   Years of education: Not on file   Highest education level: Some college, no degree  Occupational History   Occupation: retired  Tobacco Use   Smoking status: Former    Current packs/day: 0.00    Average packs/day: 0.8 packs/day for 30.0 years (22.5 ttl pk-yrs)    Types: Cigarettes    Start date: 12/22/1974    Quit date:  12/21/2004    Years since quitting: 19.1   Smokeless tobacco: Never  Vaping Use   Vaping status: Never Used  Substance and Sexual Activity   Alcohol use: No   Drug use: No   Sexual activity:  Not on file  Other Topics Concern   Not on file  Social History Narrative   Patient is right-handed. She lives in a one level home. She drinks two 8 oz glasses of tea a day, an occasional soda and rarely coffee.    Social Drivers of Corporate investment banker Strain: Not on file  Food Insecurity: Low Risk  (01/10/2024)   Received from [facility]   Hunger Vital Sign    Within the past 12 months, you worried that your food would run out before you got money to buy more: Never true    Within the past 12 months, the food you bought just didn't last and you didn't have money to get more. : Never true  Transportation Needs: No Transportation Needs (01/10/2024)   Received from [facility]    In the past 12 months, has lack of reliable transportation kept you from medical appointments, meetings, work or from getting things needed for daily living? : No  Physical Activity: Not on file  Stress: Not on file  Social Connections: Not on file  Intimate Partner Violence: Not on file    Environmental and Social history  Lives in a townhouse with a dry environment, no animals located to the household, carpet in the bedroom, plastic on the bed, plastic on the pillow, and no smoking ongoing with inside the household.  Objective:   Vitals:   02/07/24 0929  BP: (!) 142/62  Pulse: 67  Resp: 20  Temp: 98.2 F (36.8 C)  SpO2: 97%   Height: 4' 11 (149.9 cm) Weight: 141 lb (64 kg)  Physical Exam Constitutional:      Appearance: She is not diaphoretic.  HENT:     Head: Normocephalic.     Right Ear: Tympanic membrane, ear canal and external ear normal.     Left Ear: Tympanic membrane, ear canal and external ear normal.     Nose: Nose normal. No mucosal edema or rhinorrhea.      Mouth/Throat:     Pharynx: Uvula midline. No oropharyngeal exudate.   Eyes:     Conjunctiva/sclera: Conjunctivae normal.   Neck:     Thyroid : No thyromegaly.     Trachea: Trachea normal. No tracheal tenderness or tracheal deviation.   Cardiovascular:     Rate and Rhythm: Normal rate and regular rhythm.     Heart sounds: Normal heart sounds, S1 normal and S2 normal. No murmur heard. Pulmonary:     Effort: No respiratory distress.     Breath sounds: Normal breath sounds. No stridor. No wheezing or rales.  Lymphadenopathy:     Head:     Right side of head: No tonsillar adenopathy.     Left side of head: No tonsillar adenopathy.     Cervical: No cervical adenopathy.   Skin:    Findings: No erythema or rash.     Nails: There is no clubbing.   Neurological:     Mental Status: She is alert.     Diagnostics: Allergy skin tests were not performed.   Assessment and Plan:    1. Adverse drug effect, initial encounter    1. Arrange for penicillin skin testing and amoxicillin challenge  2. Ask your cardiologist if you can use meloxicam for pain relief  3. Influenza = Tamiflu. Covid = molnupiravir  We will have Stephane work through her apparent beta-lactam hypersensitivity by arranging for an amoxicillin challenge.  We will get that arranged sometime over the  next 1 to 2 weeks.  She also has pretty significant arthralgia that may be tied up with polymyalgia rheumatica and receiving systemic steroid injections every 4 months and maybe she would benefit from using meloxicam and if she can get clearance from her cardiologist to use meloxicam then that may be a viable alternative to her systemic steroid use.  She can work through this issue with her cardiologist and rheumatologist.  Sydney DOROTHA Denis, MD Allergy / Immunology [facility] [place] 

## 2024-02-07 NOTE — Patient Instructions (Addendum)
  1. Arrange for penicillin skin testing and amoxicillin challenge  2. Ask your cardiologist if you can use meloxicam for pain relief  3. Influenza = Tamiflu. Covid = molnupiravir

## 2024-02-08 ENCOUNTER — Encounter: Payer: Self-pay | Admitting: Allergy and Immunology

## 2024-02-10 ENCOUNTER — Ambulatory Visit: Admitting: Cardiology

## 2024-02-10 NOTE — Progress Notes (Addendum)
 Pt was here today for B-12 injection.  Last one was given on 01/04/2024.  It was administered in Right Deltoid IM.  Medication was provided by our office.  Pt tolerated well.  Advised to schedule the next injection for 31 days.    Darice Sprague, FNP-BC

## 2024-03-14 NOTE — Progress Notes (Signed)
 [name] is a 83 y.o.female.  Subjective:   [name] is an 83 year old female who presents today with multiple complaints.  Patient states that she has not felt good since last week.  She has had nausea for the past week now.  She tells me over the weekend last weekend she ate hamburger with onion and lettuce and mayonnaise and she tells me that made her sick and she threw up.  She continued having abdominal pain even after throwing up the hamburger.  She also complains of chronic constipation.  She has been on MiraLAX now for quite some time as she tells me that it just makes her so sick that she is not able to drink it.  I have wrote her prescription for Linzess to see if that does help with the constipation.  She also has a history of hyponatremia.  She wanted me to update a CMP today for her.  She is currently taking sodium pills twice a day.  She is also complaining about chronic osteoarthritis of multiple joints.  She was requesting a Kenalog  injection today.  Plan:    Patient recently had a B12.  I told her prior to the B12 injection that could be the reason for her feeling so tired all the time.  I reminded her that she is 83 years old and that she is going to slow down a little bit and that it is okay.  She received a Kenalog  60 mg today.  She also wanted someone to wash out her right ear because she felt like it was clogged up.  I did check her ears bilaterally and she had a very small amount of wax in the right ear.  Raynae, CMA irrigated her right ear.  Patient tolerated well.  Pending labs determines further plan of care she can follow-up as needed.  I did send her in Zofran  8 mg also today for the nausea.  She does have a history of diverticulosis and we recently did a CT scan back in June of her abdomen and that was all that it showed was diverticulosis.  She has seen Dr. Larene in the past.  She thinks her last visit was 2 years ago.  Today I have evaluated and managed two or  more of this patient's chronic stable/unstable health problems.  Today I have reviewed and managed this patient's medications related to their health problems and/or I have prescribed or renewed prescriptions as necessary for health problems.  _____________________________________      Diagnoses this Encounter 1. Chronic idiopathic constipation   2. Nausea without vomiting   3. Primary osteoarthritis involving multiple joints   4. Hyponatremia    Chief Complaint  Patient presents with  . Follow-up   Patient's Medications  New Prescriptions   LINACLOTIDE (LINZESS) 72 MCG CAP CAPSULE    Take 1 capsule (72 mcg total) by mouth daily before meal. Take on an empty stomach, at least 30 minutes prior to a meal, and at approximately the same time each day   ONDANSETRON  (ZOFRAN ) 8 MG TABLET    Take 1 tablet (8 mg total) by mouth every 8 (eight) hours as needed for nausea or vomiting.  Previous Medications   ACETAMINOPHEN  (TYLENOL ) 500 MG TABLET    Take 1,000 mg by mouth.   ALBUTEROL HFA (PROVENTIL HFA;VENTOLIN HFA;PROAIR HFA) 90 MCG/ACTUATION INHALER    INHALE 2 PUFFS EVERY 6 HOURS AS NEEDED FOR WHEEZING   ALPRAZOLAM (XANAX) 0.25 MG TABLET    Take  1 tablet (0.25 mg total) by mouth 3 (three) times a day as needed for anxiety.   CHOLECALCIFEROL (VITAMIN D3) 5,000 UNIT (125 MCG) TAB TABLET    Take 5,000 Units by mouth Once Daily.   DILTIAZEM  (CARDIZEM  CD) 180 MG 24 HR CAPSULE    TAKE 1 CAPSULE (180 MG TOTAL) BY MOUTH 2 TIMES DAILY.   ELIQUIS 5 MG TAB    TAKE 1 TABLET BY MOUTH TWICE A DAY FOR TREATMENT TO PREVENT BLOOD CLOTS IN CHRONIC ATRIAL FIBRILLATION.   HYDROCORTISONE-PRAMOXINE (PRAMOSONE) 2.5-1 % CREA CREAM    Apply topically 4 (four) times a day as needed (rectal itching and pain).   LEVOTHYROXINE (SYNTHROID) 75 MCG TABLET    Take 1 tablet (75 mcg total) by mouth every morning.   LORATADINE (CLARITIN) 10 MG CAP CAPSULE    Take 10 mg by mouth per protocol (see comments).   LOSARTAN (COZAAR)  50 MG TABLET    Take 1 tablet (50 mg total) by mouth daily.   MONTELUKAST (SINGULAIR) 10 MG TABLET    Take 1 tablet (10 mg total) by mouth at bedtime.   OMEPRAZOLE (PRILOSEC) 40 MG DR CAPSULE    Take 1 capsule (40 mg total) by mouth in the morning.   OXYBUTYNIN (DITROPAN XL) 5 MG 24 HR TABLET    Take 1 tablet (5 mg total) by mouth daily.   POTASSIUM CHLORIDE (KLOR-CON) 10 MEQ ER TABLET    Take 1 tablet (10 mEq total) by mouth daily for 10 days.   SIMVASTATIN (ZOCOR) 40 MG TABLET    TAKE 1 TABLET BY MOUTH AT BEDTIME INDICATIONS: HIGH CHOLESTEROL.  Modified Medications   No medications on file  Discontinued Medications   No medications on file   Orders Placed This Encounter  Procedures  . Comprehensive Metabolic Panel   Physical Exam Vitals and nursing note reviewed.  Constitutional:      Appearance: Normal appearance.  HENT:     Head: Normocephalic and atraumatic.     Right Ear: Tympanic membrane normal.     Left Ear: Tympanic membrane normal.     Nose: Nose normal.     Mouth/Throat:     Mouth: Mucous membranes are moist.     Pharynx: Oropharynx is clear.   Eyes:     Pupils: Pupils are equal, round, and reactive to light.    Cardiovascular:     Rate and Rhythm: Normal rate.     Pulses: Normal pulses.  Pulmonary:     Effort: Pulmonary effort is normal.  Abdominal:     General: Bowel sounds are normal.     Palpations: Abdomen is soft.   Musculoskeletal:        General: Normal range of motion.     Cervical back: Normal range of motion.   Skin:    General: Skin is warm and dry.   Neurological:     General: No focal deficit present.     Mental Status: She is alert.     Cranial Nerves: Cranial nerves 2-12 are intact.     Sensory: Sensation is intact.     Motor: Motor function is intact.     Coordination: Coordination is intact.     Gait: Gait is intact.   Psychiatric:        Attention and Perception: Attention and perception normal.        Mood and Affect: Mood  normal.        Speech: Speech normal.  Behavior: Behavior normal.        Thought Content: Thought content normal.        Cognition and Memory: Cognition and memory normal.        Judgment: Judgment normal.     _____________________ Vitals:   03/14/24 1410  BP: 132/80  BP Location: Left arm  Patient Position: Sitting  Pulse: 67  Resp: 18  Temp: 97.2 F (36.2 C)  SpO2: 96%  Weight: 64.9 kg (143 lb)  Height: 1.549 m (5' 1)   Body mass index is 27.02 kg/m.  Problem List[1] Medical History[2] Surgical History[3] Family History[4] Social History   Socioeconomic History  . Marital status: Widowed    Spouse name: Not on file  . Number of children: Not on file  . Years of education: Not on file  . Highest education level: Not on file  Occupational History  . Not on file  Tobacco Use  . Smoking status: Former    Current packs/day: 0.00    Average packs/day: 0.5 packs/day for 46.0 years (23.0 ttl pk-yrs)    Types: Cigarettes    Start date: 60    Quit date: 2006    Years since quitting: 19.6  . Smokeless tobacco: Never  Substance and Sexual Activity  . Alcohol use: Not Currently  . Drug use: Not on file  . Sexual activity: Not on file  Other Topics Concern  . Not on file  Social History Narrative  . Not on file   Social Drivers of Health   Food Insecurity: Low Risk  (01/10/2024)   Food vital sign   . Within the past 12 months, you worried that your food would run out before you got money to buy more: Never true   . Within the past 12 months, the food you bought just didn't last and you didn't have money to get more: Never true  Transportation Needs: No Transportation Needs (01/10/2024)   Transportation   . In the past 12 months, has lack of reliable transportation kept you from medical appointments, meetings, work or from getting things needed for daily living? : No  Safety: Low Risk  (02/04/2024)   Safety   . How often does anyone, including family and  friends, physically hurt you?: Never   . How often does anyone, including family and friends, insult or talk down to you?: Never   . How often does anyone, including family and friends, threaten you with harm?: Never   . How often does anyone, including family and friends, scream or curse at you?: Never  Living Situation: Low Risk  (01/10/2024)   Living Situation   . What is your living situation today?: I have a steady place to live   . Think about the place you live. Do you have problems with any of the following? Choose all that apply:: None/None on this list   Allergies[5]  Review of Systems  Some components of the Review of Systems, Social History, Past Medical History, Family History, and Vital Signs were taken from the patient and documented by the medical assistant assisting the provider today.  Electronically signed by: Janey Sprung, CMA 03/14/2024 2:09 PM       [1] Patient Active Problem List Diagnosis  . Anxiety  . Hypothyroidism  . Rectocele  . Neuropathy  . Paroxysmal atrial fibrillation    (CMD)  . Long term current use of anticoagulant therapy  . GERD (gastroesophageal reflux disease)  . Hyperlipidemia  . Urethral stricture  .  Carotid atherosclerosis  . Congenital anomaly of pulmonary vein  . Hyponatremia  . Mild CAD  . Ventral hernia  . Primary osteoarthritis involving multiple joints  . Fatigue  . B12 deficiency  . Mild episode of depression  . Hypertensive heart disease  . Frequent nosebleeds  . Congestive heart failure    (CMD)  . Abdominal pain  . Diverticulitis  . Atrial paroxysmal tachycardia (CMD)  . Atrial flutter    (CMD)  . Anemia of chronic disease  . Pleural effusion  . Pulmonary edema (CMD)  . Sigmoid diverticulitis  . Vaginal prolapse  . Internal hemorrhoids with complication  [2] Past Medical History: Diagnosis Date  . Anterior urethral stricture   . Anxiety   . Atrial fibrillation    (CMD)   . Diverticulitis   .  Hypertension   . Paroxysmal atrial fibrillation    (CMD) 01/10/2014   Formatting of this note might be different from the original.  Overview:   had pulmonary vein ablation for AF in 2010 and redo for recurrence in April 2012   * CHADS2 score=2    Overview:   had pulmonary vein ablation for AF in 2010 and redo for recurrence in April 2012   * CHADS2 score=2 Formatting of this note might be different from the original.  had pulmonary vein ablation for AF in   . Thyroid  disease   [3] Past Surgical History: Procedure Laterality Date  . CARDIAC ELECTROPHYSIOLOGY STUDY AND ABLATION     Procedure: CARDIAC ELECTROPHYSIOLOGY STUDY AND ABLATION  . COLPORRHAPHY     Procedure: COLPORRHAPHY  . CYSTOSCOPY N/A 03/28/2020   Procedure: CYSTOSCOPY;  Surgeon: Dorothyann Jenkins Ku, MD;  Location: [facility] OUTPATIENT OR;  Service: Urology;  Laterality: N/A;  . EXAMINATION UNDER ANESTHESIA N/A 03/28/2020   Procedure: EXAM UNDER ANESTHESIA (EUA);  Surgeon: Dorothyann Jenkins Ku, MD;  Location: [facility] OUTPAT[facility] OR;  Service: Urology;  Laterality: N/A;  . HERNIA REPAIR     Procedure: HERNIA REPAIR  . HYSTERECTOMY      Procedure: HYSTERECTOMY  . OTHER SURGICAL HISTORY     Procedure: OTHER SURGICAL HISTORY (dilation of urethra)  . SIGMOIDOSCOPY     Procedure: SIGMOIDOSCOPY  . TUBAL LIGATION     Procedure: TUBAL LIGATION  [4] Family History Problem Relation Name Age of Onset  . Heart disease Mother    . Stroke Mother    . Heart attack Father    [5] Allergies Allergen Reactions  . Amiodarone Analogues GI Intolerance  . Protamine Anaphylaxis  . Ace Inhibitors Cough  . Dexamethasone  Psychosis    Solumedrol in large doses at recent hospital visit results in psychosis.  Other Reaction(s): Psychosis    Solumedrol in large doses at recent hospital visit results in psychosis.    Other Reaction(s): Psychosis  Solumedrol in large doses at recent hospital visit results in psychosis.  . Lisinopril Cough  .  Amoxicillin Rash  . Celecoxib Rash  . Clindamycin Rash  . Clindamycin Hcl Rash  . Fluconazole Rash

## 2024-03-20 NOTE — Progress Notes (Signed)
 Cardiology Office Note:    Date:  03/21/2024   ID:  [name], DOB [birth year], MRN [id number]  PCP:  Sydney Darice BROCKS, FNP  Cardiologist:  Redell Leiter, MD    Referring MD: Sydney Darice BROCKS, FNP   with her hyponatremia please do a fasting early morning cortisol at her next office visit before Kenalog  injection   ASSESSMENT:    1. Paroxysmal atrial fibrillation (HCC)   2. Chronic anticoagulation   3. Hypertensive heart disease without heart failure   4. Mixed hyperlipidemia   5. Hyponatremia    PLAN:    In order of problems listed above:  From my perspective doing well not having recurrent atrial fibrillation I did not repeat the EKG done in the last year we will continue her anticoagulant and rate limiting calcium channel blocker Continue current lipid-lowering treatment statin Dilutional hyponatremia yesterday stop salt tablets fluid restrict and I will ask her PCP before her next Kenalog  injection next office visit to do her fasting cortisol level   Next appointment: She feels comfortable seeing me in 1 year follow-up   Medication Adjustments/Labs and Tests Ordered: Current medicines are reviewed at length with the patient today.  Concerns regarding medicines are outlined above.  No orders of the defined types were placed in this encounter.  No orders of the defined types were placed in this encounter.    History of Present Illness:    [name] is a 83 y.o. female with a hx of atrial fibrillation with multiple EP catheter ablations and chronic anticoagulation hypertensive heart disease and hyperlipidemia last seen 08/25/2023.  Recent labs 01/06/2024 hemoglobin 13.0 TSH normal 1.3 sodium diminished 129 potassium 4.7 creatinine 0.79 GFR 74 cc/min  Compliance with diet, lifestyle and medications: Yes  From my perspective she is doing well she has had no recent episodes of palpitation is not having edema shortness of breath chest pain or syncope She is quite  concerned about dilutional hyponatremia unfortunately she drinks large volumes of liquid I discussed the physiology of and asked her to restrict to 3 L/day twelve 8 ounce cups and to stop adding salt to her diet she agrees She is getting Kenalog  every 3 months she may have adrenal insufficiency I will ask her PCP to draw a fasting cortisol level at her next office visit Home blood pressure consistently in the range of 130/80 Past Medical History:  Diagnosis Date    Possible Neuropathy 04/16/2015   Abdominal pain 01/10/2024   Acute bronchitis 01/10/2024   Adverse effect of drug 04/16/2015   Anemia of chronic disease 01/10/2024   Anesthesia to pain 03/02/2017   Overview:  reaction to Protamine with 2nd cardiac ablation.   Anticoagulant long-term use    ELIQUIS   Anticoagulated 06/20/2016   Anxiety 12/05/2015   Ataxia 01/10/2024   Atrial fibrillation (HCC) 01/10/2014   Atrial flutter by electrocardiogram (HCC) 06/06/2014   Atrial flutter, paroxysmal (HCC)    Atypical atrial flutter (HCC) 10/21/2016   B12 deficiency 08/11/2022   Bilateral atelectasis 01/10/2024   Bradycardia 02/28/2021   Callus of foot 05/11/2022   Carotid atherosclerosis 04/21/2018   Chronic anticoagulation 02/13/2015   Community acquired pneumonia 01/10/2024   Congenital anomaly of pulmonary vein    per Cardiac MRI done at [facility] 02-16-2014-- noted 5 pulmonary veins (3 on the right and 2 on the left ) to left atrium   Congestive heart failure (HCC) 01/10/2014   Current use of long term anticoagulation 02/13/2015   Diverticulitis 01/10/2024  Drug reaction to Amoxicillin  04/16/2015   Eczema 04/16/2015   Elevated troponin 01/10/2024   Encounter for monitoring sotalol therapy 06/06/2014   Encounter for therapeutic drug level monitoring 06/06/2014   Esophageal reflux 01/10/2014   First degree heart block    Frequent nosebleeds 11/10/2022   Gastroesophageal reflux disease 01/10/2014   GERD (gastroesophageal reflux  disease)    High risk medication use 02/13/2015   Overview:  Ticosyn   History of amiodarone therapy 06/20/2016   History of diverticulitis 06/25/2016   History of diverticulitis of colon    04/ 2017   History of pneumonia 02/01/2023   Hyperlipidemia    Hypertension    Hypertensive heart disease 06/20/2021   Hypertensive urgency 01/10/2024   Hyponatremia 05/17/2016   Hypothyroid 12/05/2015   Hypothyroidism    Hypothyroidism (acquired) 01/10/2014   Internal hemorrhoids with complication 01/10/2024   Long term current use of antiarrhythmic drug 10/15/2016   Malaise and fatigue 05/11/2022   Mild CAD 01/17/2019   Mild episode of depression 08/11/2022   Nausea and vomiting 01/10/2024   On amiodarone therapy 03/23/2016   Ovarian cyst 12/05/2015   PAF (paroxysmal atrial fibrillation) [facility]) cardiologist- dr monetta jennye)   Parainfluenza virus bronchitis 01/10/2024   Paroxysmal atrial fibrillation (HCC) 02/13/2015   Overview:  had pulmonary vein ablation for AF in 2010 and redo for recurrence in April 2012  * CHADS2 score=2  Overview:  had pulmonary vein ablation for AF in 2010 and redo for recurrence in April 2012  * CHADS2 score=2   PAT (paroxysmal atrial tachycardia) (HCC)    Pleural effusion 01/10/2024   Pre-operative cardiovascular examination 03/03/2017   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    oxybutynin (DITROPAN-XL) 5 MG 24 hr tablet Take 5 mg by mouth daily.      EKGs/Labs/Other Studies Reviewed:    The following studies were reviewed today:  Cardiac Studies & Procedures   ______________________________________________________________________________________________   STRESS TESTS  MYOCARDIAL PERFUSION IMAGING 03/16/2017  Interpretation Summary  Nuclear stress EF: 75%.  No T wave inversion was noted during stress.  There was no ST segment deviation noted during stress.  Defect 1: There is a small defect of moderate severity.  This is a low risk study.  Small size, moderate intensity fixed anteroapical attenuation artifact. No significant reversible ischemia. LVEF 75% with normal wall motion. This is a low risk study.   ECHOCARDIOGRAM  ECHOCARDIOGRAM COMPLETE 06/30/2021  Narrative ECHOCARDIOGRAM REPORT    Patient Name:   [name] Date of Exam: 06/30/2021 Medical Rec #:  [id number]     Height:       61.6 in Accession #:    [id number]    Weight:       138.4 lb Date of Birth:  1941/04/03     BSA:          1.627 m Patient Age:    58  years      BP:           162/82 mmHg Patient Gender: F             HR:           64 bpm. Exam Location:  Micro  Procedure: 2D Echo, Cardiac Doppler, Color Doppler and Strain Analysis  Indications:    Nonspecific abnormal electrocardiogram (ECG) (EKG) [R94.31 (ICD-10-CM)]  History:        Patient has no prior history of Echocardiogram examinations. CHF, CAD, S/P ablation of atrial fibrillation, Arrythmias:Atrial Fibrillation, First degree heart block and Bradycardia, Signs/Symptoms:Hypertensive heart disease without heart failure; Risk Factors:Dyslipidemia.  Sonographer:    Lynwood Silvas RDCS Referring Phys: [id number] Christie Viscomi J Aubryanna Nesheim  IMPRESSIONS   1. Left ventricular ejection fraction, by estimation, is 60 to 65%. The left ventricle has normal function. The left ventricle has no regional wall motion abnormalities.  There is mild concentric left ventricular hypertrophy. Left ventricular diastolic parameters are indeterminate. The average left ventricular global longitudinal strain is -15.4 %. 2. Right ventricular systolic function is normal. The right ventricular size is normal. There is moderately elevated pulmonary artery systolic pressure. 3. Left atrial size was mildly dilated. 4. The mitral valve is degenerative. Mild mitral valve regurgitation. No evidence of mitral stenosis. 5. Tricuspid valve regurgitation is moderate. 6. The aortic valve is tricuspid. Aortic valve regurgitation is not visualized. No aortic stenosis is present. 7. The inferior vena cava is normal in size with greater than 50% respiratory variability, suggesting right atrial pressure of 3 mmHg.  FINDINGS Left Ventricle: Left ventricular ejection fraction, by estimation, is 60 to 65%. The left ventricle has normal function. The left ventricle has no regional wall motion abnormalities. The average left ventricular global longitudinal strain is -15.4 %. The left ventricular internal cavity size was normal in size. There is mild concentric left ventricular hypertrophy. Left ventricular diastolic parameters are indeterminate.  Right Ventricle: The right ventricular size is normal. No increase in right ventricular wall thickness. Right ventricular systolic function is normal. There is moderately elevated pulmonary artery systolic pressure. The tricuspid regurgitant velocity is 3.32 m/s, and with an assumed right atrial pressure of 3 mmHg, the estimated right ventricular systolic pressure is 47.1 mmHg.  Left Atrium: Left atrial size was mildly dilated.  Right Atrium: Right atrial size was normal in size.  Pericardium: There is no evidence of pericardial effusion.  Mitral Valve: The mitral valve is degenerative in appearance. Mild mitral annular calcification. Mild mitral valve regurgitation. No evidence of mitral valve  stenosis.  Tricuspid Valve: The tricuspid valve is normal in structure. Tricuspid valve regurgitation is moderate . No evidence of tricuspid stenosis.  Aortic Valve: The aortic valve is tricuspid. Aortic valve regurgitation is not visualized. No aortic stenosis is present.  Pulmonic Valve: The pulmonic valve was normal in structure. Pulmonic valve regurgitation is not visualized. No evidence of pulmonic stenosis.  Aorta: The aortic root and ascending aorta are structurally normal, with no evidence of dilitation and the aortic arch and DTA were not well visualized.  Venous: The pulmonary veins were not well visualized. The inferior vena cava is normal in size with greater than 50% respiratory variability, suggesting right atrial pressure of 3 mmHg.  IAS/Shunts: No atrial level shunt detected by color flow Doppler.   LEFT VENTRICLE PLAX 2D LVIDd:         4.40 cm   Diastology LVIDs:         2.50 cm  LV e' medial:    7.40 cm/s LV PW:         1.10 cm   LV E/e' medial:  11.8 LV IVS:        1.20 cm   LV e' lateral:   7.40 cm/s LVOT diam:     2.00 cm   LV E/e' lateral: 11.8 LV SV:         82 LV SV Index:   50        2D Longitudinal Strain LVOT Area:     3.14 cm  2D Strain GLS Avg:     -15.4 %   RIGHT VENTRICLE         IVC TAPSE (M-mode): 2.8 cm  IVC diam: 1.70 cm  LEFT ATRIUM             Index        RIGHT ATRIUM           Index LA diam:        3.50 cm 2.15 cm/m   RA Area:     16.70 cm LA Vol (A2C):   57.8 ml 35.52 ml/m  RA Volume:   40.65 ml  24.98 ml/m LA Vol (A4C):   61.0 ml 37.49 ml/m LA Biplane Vol: 60.1 ml 36.93 ml/m AORTIC VALVE LVOT Vmax:   120.00 cm/s LVOT Vmean:  79.900 cm/s LVOT VTI:    0.261 m  AORTA Ao Root diam: 3.00 cm Ao Asc diam:  3.30 cm Ao Desc diam: 2.00 cm  MITRAL VALVE               TRICUSPID VALVE MV Area (PHT): 3.35 cm    TR Peak grad:   44.1 mmHg MV Decel Time: 227 msec    TR Vmax:        332.00 cm/s MV E velocity: 87.50 cm/s MV A velocity:  23.00 cm/s  SHUNTS MV E/A ratio:  3.80        Systemic VTI:  0.26 m Systemic Diam: 2.00 cm  Redell Leiter MD Electronically signed by Redell Leiter MD Signature Date/Time: 06/30/2021/4:12:26 PM    Final    MONITORS  LONG TERM MONITOR (3-14 DAYS) 03/07/2021  Narrative Patch Wear Time:  3 days and 1 hours (2022-07-22T16:06:16-0400 to 2022-07-25T17:15:18-0400)  Patient had a min HR of 45 bpm, max HR of 136 bpm, and avg HR of 59 bpm. Predominant underlying rhythm was Sinus Rhythm. 12 Supraventricular Tachycardia runs occurred, the run with the fastest interval lasting 4 beats with a max rate of 136 bpm, the longest lasting 7 beats with an avg rate of 113 bpm. Isolated SVEs were occasional (3.7%, 9312), SVE Couplets were rare (<1.0%, 88), and SVE Triplets were rare (<1.0%, 25). Isolated VEs were rare (<1.0%), VE Couplets were rare (<1.0%), and no VE Triplets were present. Ventricular Trigeminy was present.  Supraventricular ectopy was occasional burden 3.7% and no episodes of atrial fibrillation or flutter.  There were 12 brief runs of APCs 1 of which appears to be atrial tachycardia with variable conduction the longest 7 complexes rate of 113 bpm.  Ventricular ectopy was rare.  There were no pauses of 3 seconds or greater and no episodes of second or third-degree AV nodal block or sinus node exit block.  There were no triggered or symptomatic events.       ______________________________________________________________________________________________          Recent Labs: 08/17/2023: ALT 14; Hemoglobin 12.3; Platelets 345 08/25/2023: BUN 11; Creatinine, Ser 0.76;  NT-Pro BNP 261; Potassium 4.5; Sodium 137  Recent Lipid Panel No results found for: CHOL, TRIG, HDL, CHOLHDL, VLDL, LDLCALC, LDLDIRECT  Physical Exam:    VS:  BP (!) 160/78   Pulse 67   Ht 4' 11 (1.499 m)   Wt 141 lb 9.6 oz (64.2 kg)   SpO2 98%   BMI 28.60 kg/m     Wt Readings from Last 3  Encounters:  03/21/24 141 lb 9.6 oz (64.2 kg)  02/07/24 141 lb (64 kg)  08/25/23 139 lb (63 kg)     GEN:  Well nourished, well developed in no acute distress anxious HEENT: Normal NECK: No JVD; No carotid bruits LYMPHATICS: No lymphadenopathy CARDIAC: RRR, no murmurs, rubs, gallops RESPIRATORY:  Clear to auscultation without rales, wheezing or rhonchi  ABDOMEN: Soft, non-tender, non-distended MUSCULOSKELETAL:  No edema; No deformity  SKIN: Warm and dry NEUROLOGIC:  Alert and oriented x 3 PSYCHIATRIC:  Normal affect    Signed, Redell Leiter, MD  03/21/2024 12:04 PM     [facility]

## 2024-03-21 ENCOUNTER — Ambulatory Visit: Attending: Cardiology | Admitting: Cardiology

## 2024-03-21 VITALS — BP 160/78 | HR 67 | Ht 59.0 in | Wt 141.6 lb

## 2024-03-21 DIAGNOSIS — Z7901 Long term (current) use of anticoagulants: Secondary | ICD-10-CM | POA: Diagnosis not present

## 2024-03-21 DIAGNOSIS — E782 Mixed hyperlipidemia: Secondary | ICD-10-CM | POA: Diagnosis not present

## 2024-03-21 DIAGNOSIS — I480 Paroxysmal atrial fibrillation: Secondary | ICD-10-CM

## 2024-03-21 DIAGNOSIS — I119 Hypertensive heart disease without heart failure: Secondary | ICD-10-CM | POA: Diagnosis not present

## 2024-03-21 DIAGNOSIS — E871 Hypo-osmolality and hyponatremia: Secondary | ICD-10-CM

## 2024-03-21 NOTE — Patient Instructions (Signed)
 Medication Instructions:  Your physician recommends that you continue on your current medications as directed. Please refer to the Current Medication list given to you today.  *If you need a refill on your cardiac medications before your next appointment, please call your pharmacy*  Lab Work: None If you have labs (blood work) drawn today and your tests are completely normal, you will receive your results only by: MyChart Message (if you have MyChart) OR A paper copy in the mail If you have any lab test that is abnormal or we need to change your treatment, we will call you to review the results.  Testing/Procedures: None  Follow-Up: At [facility], you and your health needs are our priority.  As part of our continuing mission to provide you with exceptional heart care, our providers are all part of one team.  This team includes your primary Cardiologist (physician) and Advanced Practice Providers or APPs (Physician Assistants and Nurse Practitioners) who all work together to provide you with the care you need, when you need it.  Your next appointment:   1 year(s)  Provider:   Redell Leiter, MD    We recommend signing up for the patient portal called MyChart.  Sign up information is provided on this After Visit Summary.  MyChart is used to connect with patients for Virtual Visits (Telemedicine).  Patients are able to view lab/test results, encounter notes, upcoming appointments, etc.  Non-urgent messages can be sent to your provider as well.   To learn more about what you can do with MyChart, go to [email or web address].   Other Instructions Fluid restriction to 3 liters per day = 12  8 ounce cups.

## 2024-04-03 ENCOUNTER — Other Ambulatory Visit: Payer: Self-pay | Admitting: *Deleted

## 2024-04-03 MED ORDER — AMOXICILLIN 250 MG/5ML PO SUSR: ORAL | 0 refills | Status: DC

## 2024-04-04 ENCOUNTER — Encounter: Admitting: Allergy

## 2024-05-02 NOTE — Progress Notes (Signed)
 Patient in today for urinary catheterization due to anterior urethral stricture. Patient brought in her own supplies for this procedure. Pt tolerated procedure well today. Will follow up in 1 month.

## 2024-05-08 ENCOUNTER — Other Ambulatory Visit: Payer: Self-pay

## 2024-05-08 MED ORDER — AMOXICILLIN 250 MG/5ML PO SUSR: ORAL | 0 refills | Status: AC

## 2024-05-09 ENCOUNTER — Ambulatory Visit (INDEPENDENT_AMBULATORY_CARE_PROVIDER_SITE_OTHER): Admitting: Allergy

## 2024-05-09 ENCOUNTER — Encounter: Payer: Self-pay | Admitting: Allergy

## 2024-05-09 VITALS — BP 148/60 | HR 70 | Temp 97.7°F | Resp 16

## 2024-05-09 DIAGNOSIS — Z888 Allergy status to other drugs, medicaments and biological substances status: Secondary | ICD-10-CM | POA: Diagnosis not present

## 2024-05-09 DIAGNOSIS — T50905D Adverse effect of unspecified drugs, medicaments and biological substances, subsequent encounter: Secondary | ICD-10-CM

## 2024-05-09 NOTE — Progress Notes (Signed)
 Follow-up Note  RE: [name] MRN: [id number] DOB: Mar 04, 1941 Date of Office Visit: 05/09/2024   History of present illness: [name] is a 83 y.o. female presenting today for drug challenge to Amoxicillin .  She was last seen in the office on 02/07/2024 by Dr. Maurilio.  She is in her usual state of health today without recent illness.  She has held antihistamines for at least 3 days for testing today.  She last recalls taking Augmentin in 2010 and does not recall having any issues with taking it but states she was given a dose of Benadryl  before she received the first dose.  Prior to that she states she has only had rash with taking amoxicillin  that that occurred around day 6 of the course.   Review of systems: 10pt ROS negative unless noted above in HPI  Past medical/social/surgical/family history have been reviewed and are unchanged unless specifically indicated below.  No changes  Medication List: Current Outpatient Medications  Medication Sig Dispense Refill   acetaminophen  (TYLENOL ) 500 MG tablet Take 1,000 mg by mouth every 6 (six) hours as needed for mild pain or moderate pain.     albuterol (VENTOLIN HFA) 108 (90 Base) MCG/ACT inhaler Inhale 2 puffs into the lungs every 6 (six) hours as needed for wheezing or shortness of breath.     ALPRAZolam (XANAX) 0.25 MG tablet Take 0.25 mg by mouth 3 (three) times daily as needed for anxiety.     amoxicillin  (AMOXIL ) 250 MG/5ML suspension Bring powder into clinic for oral drug challenge. 30 mL 0   Calcium Carbonate Antacid (TUMS PO) Take by mouth.     CALCIUM MAGNESIUM ZINC PO Take by mouth.     Cholecalciferol 125 MCG (5000 UT) TABS Take 5,000 Units by mouth daily.     cyanocobalamin  (VITAMIN B12) 1000 MCG/ML injection Inject 1,000 mcg into the muscle every 30 (thirty) days.     diltiazem  (CARDIZEM  CD) 180 MG 24 hr capsule Take 180 mg by mouth 2 (two) times daily.     ELIQUIS 5 MG TABS tablet Take 5 mg by mouth 2 (two) times  daily.     Flaxseed, Linseed, (FLAX SEED OIL PO) Take by mouth.     levothyroxine (SYNTHROID) 75 MCG tablet Take 75 mcg by mouth daily before breakfast.     loratadine (CLARITIN) 10 MG tablet Take 10 mg by mouth as needed for allergies.     losartan (COZAAR) 50 MG tablet Take 50 mg by mouth daily.     Multiple Vitamin (MULTIVITAMIN) capsule Take 1 capsule by mouth daily.     ondansetron  (ZOFRAN -ODT) 4 MG disintegrating tablet Take 4 mg by mouth 4 (four) times daily as needed for nausea or vomiting.     pantoprazole (PROTONIX) 40 MG tablet Take 40 mg by mouth daily.     simvastatin (ZOCOR) 40 MG tablet Take 40 mg by mouth at bedtime.     linaclotide (LINZESS) 72 MCG capsule Take 72 mcg by mouth daily before breakfast. (Patient not taking: Reported on 05/09/2024)     montelukast (SINGULAIR) 10 MG tablet Take 10 mg by mouth at bedtime. (Patient not taking: Reported on 05/09/2024)     omeprazole (PRILOSEC) 40 MG capsule Take 40 mg by mouth daily. (Patient not taking: Reported on 05/09/2024)     oxybutynin (DITROPAN-XL) 5 MG 24 hr tablet Take 5 mg by mouth daily. (Patient not taking: Reported on 05/09/2024)     No current facility-administered medications for this visit.  Known medication allergies: Allergies  Allergen Reactions   Amiodarone Nausea Only   Protamine Anaphylaxis   Celebrex [Celecoxib] Rash   Clindamycin Rash   Ace Inhibitors Cough   Dexamethasone      Other Reaction(s): Psychosis  Solumedrol in large doses at recent hospital visit results in psychosis.  Other Reaction(s): Psychosis    Solumedrol in large doses at recent hospital visit results in psychosis.   Cleocin [Clindamycin Hcl] Rash   Diflucan [Fluconazole] Rash    Recently took without complication 02/05/16     Physical examination: Blood pressure (!) 148/60, pulse 70, temperature 97.7 F (36.5 C), temperature source Temporal, resp. rate 16, SpO2 97%.  General: Alert, interactive, in no acute distress. HEENT:  PERRLA, TMs pearly gray, turbinates non-edematous without discharge, post-pharynx non erythematous. Neck: Supple without lymphadenopathy. Lungs: Clear to auscultation without wheezing, rhonchi or rales. {no increased work of breathing. CV: Normal S1, S2 without murmurs. Abdomen: Nondistended, nontender. Skin: Warm and dry, without lesions or rashes. Extremities:  No clubbing, cyanosis or edema. Neuro:   Grossly intact.  Diagnostics/Labs: Drug challenge to amoxicillin  250mg /5 mL.  Benefits and risks of challenge discussed and consent obtained.  He was provided with 0.1 mL, 1 mL, 9 mL every 20 minutes and consumed total of 500 mg.  She was observed for additional hour after completion of ingestion challenge.  She had no signs/symptoms of allergic reaction.  Vitals were obtained prior to discharge and remained stable.     Assessment and plan: Adverse effect of medication-resolved  Graded drug challenge to amoxicillin  performed today and successfully passed.  You are allergic to penicillin based antibiotics.  You can take penicillin-based antibiotics in the future if needed for treatment of the most appropriate infections.  Will remove penicillin from your allergy list. Please notify your medical providers that you are no longer allergic to penicillins that we can also remove the allergy label from their medical records.   Return if symptoms worsen or fail to improve.  I appreciate the opportunity to take part in [name]'s care. Please do not hesitate to contact me with questions.  Sincerely,   Sydney Brain, MD Allergy/Immunology [facility]

## 2024-05-09 NOTE — Patient Instructions (Addendum)
 Graded drug challenge to amoxicillin  performed today and successfully passed.  You are allergic to penicillin based antibiotics.  You can take penicillin-based antibiotics in the future if needed for treatment of the most appropriate infections.  Will remove penicillin from your allergy list.

## 2024-06-26 ENCOUNTER — Encounter: Payer: Self-pay | Admitting: Allergy
